# Patient Record
Sex: Female | Born: 1935
Health system: Southern US, Community
[De-identification: ages and names within clinical notes are randomized; demographics above are authoritative.]

## PROBLEM LIST (undated history)

## (undated) DIAGNOSIS — E7800 Pure hypercholesterolemia, unspecified: Secondary | ICD-10-CM

## (undated) DIAGNOSIS — E119 Type 2 diabetes mellitus without complications: Secondary | ICD-10-CM

## (undated) DIAGNOSIS — Z21 Asymptomatic human immunodeficiency virus [HIV] infection status: Secondary | ICD-10-CM

## (undated) DIAGNOSIS — E1121 Type 2 diabetes mellitus with diabetic nephropathy: Secondary | ICD-10-CM

## (undated) DIAGNOSIS — I10 Essential (primary) hypertension: Secondary | ICD-10-CM

## (undated) DIAGNOSIS — M67912 Unspecified disorder of synovium and tendon, left shoulder: Secondary | ICD-10-CM

## (undated) DIAGNOSIS — E785 Hyperlipidemia, unspecified: Secondary | ICD-10-CM

## (undated) DIAGNOSIS — I2510 Atherosclerotic heart disease of native coronary artery without angina pectoris: Secondary | ICD-10-CM

## (undated) DIAGNOSIS — M1990 Unspecified osteoarthritis, unspecified site: Secondary | ICD-10-CM

## (undated) DIAGNOSIS — I4891 Unspecified atrial fibrillation: Secondary | ICD-10-CM

## (undated) DIAGNOSIS — M67911 Unspecified disorder of synovium and tendon, right shoulder: Secondary | ICD-10-CM

## (undated) DIAGNOSIS — E559 Vitamin D deficiency, unspecified: Secondary | ICD-10-CM

## (undated) DIAGNOSIS — E876 Hypokalemia: Secondary | ICD-10-CM

## (undated) DIAGNOSIS — B20 Human immunodeficiency virus [HIV] disease: Secondary | ICD-10-CM

## (undated) DIAGNOSIS — I509 Heart failure, unspecified: Secondary | ICD-10-CM

## (undated) HISTORY — PX: OTHER SURGICAL HISTORY: SHX169

## (undated) HISTORY — DX: Hyperlipidemia, unspecified: E78.5

## (undated) HISTORY — DX: Type 2 diabetes mellitus with diabetic nephropathy: E11.21

## (undated) HISTORY — DX: Unspecified atrial fibrillation: I48.91

## (undated) HISTORY — DX: Atherosclerotic heart disease of native coronary artery without angina pectoris: I25.10

## (undated) HISTORY — DX: Human immunodeficiency virus (HIV) disease: B20

## (undated) HISTORY — DX: Hypercalcemia: E83.52

## (undated) HISTORY — DX: Essential (primary) hypertension: I10

## (undated) HISTORY — PX: APPENDECTOMY: SHX54

## (undated) HISTORY — DX: Hypokalemia: E87.6

## (undated) HISTORY — DX: Unspecified disorder of synovium and tendon, left shoulder: M67.912

## (undated) HISTORY — DX: Heart failure, unspecified: I50.9

## (undated) HISTORY — DX: Unspecified osteoarthritis, unspecified site: M19.90

## (undated) HISTORY — DX: Asymptomatic human immunodeficiency virus (hiv) infection status: Z21

## (undated) HISTORY — DX: Unspecified disorder of synovium and tendon, right shoulder: M67.911

## (undated) HISTORY — PX: ANKLE SURGERY: SHX546

## (undated) HISTORY — DX: Vitamin D deficiency, unspecified: E55.9

## (undated) HISTORY — PX: KIDNEY STONE SURGERY: SHX686

## (undated) HISTORY — PX: ABDOMINAL HYSTERECTOMY: SHX81

---

## 2014-09-16 ENCOUNTER — Emergency Department (HOSPITAL_BASED_OUTPATIENT_CLINIC_OR_DEPARTMENT_OTHER)
Admission: EM | Admit: 2014-09-16 | Discharge: 2014-09-16 | Disposition: A | Discharge status: Home / Self Care | Payer: Medicare Other | Attending: Emergency Medicine | Admitting: Emergency Medicine

## 2014-09-16 ENCOUNTER — Encounter (HOSPITAL_BASED_OUTPATIENT_CLINIC_OR_DEPARTMENT_OTHER): Payer: Self-pay

## 2014-09-16 DIAGNOSIS — Y9389 Activity, other specified: Secondary | ICD-10-CM | POA: Diagnosis not present

## 2014-09-16 DIAGNOSIS — I4891 Unspecified atrial fibrillation: Secondary | ICD-10-CM | POA: Diagnosis not present

## 2014-09-16 DIAGNOSIS — Z79899 Other long term (current) drug therapy: Secondary | ICD-10-CM | POA: Insufficient documentation

## 2014-09-16 DIAGNOSIS — T161XXA Foreign body in right ear, initial encounter: Secondary | ICD-10-CM | POA: Diagnosis not present

## 2014-09-16 DIAGNOSIS — Y9289 Other specified places as the place of occurrence of the external cause: Secondary | ICD-10-CM | POA: Insufficient documentation

## 2014-09-16 DIAGNOSIS — E780 Pure hypercholesterolemia: Secondary | ICD-10-CM | POA: Diagnosis not present

## 2014-09-16 DIAGNOSIS — X58XXXA Exposure to other specified factors, initial encounter: Secondary | ICD-10-CM | POA: Insufficient documentation

## 2014-09-16 DIAGNOSIS — E119 Type 2 diabetes mellitus without complications: Secondary | ICD-10-CM | POA: Diagnosis not present

## 2014-09-16 DIAGNOSIS — Y998 Other external cause status: Secondary | ICD-10-CM | POA: Diagnosis not present

## 2014-09-16 HISTORY — DX: Type 2 diabetes mellitus without complications: E11.9

## 2014-09-16 HISTORY — DX: Unspecified atrial fibrillation: I48.91

## 2014-09-16 HISTORY — DX: Pure hypercholesterolemia, unspecified: E78.00

## 2014-09-16 NOTE — ED Provider Notes (Signed)
CSN: [id number]     Arrival date & time 09/16/14  1133 History   First MD Initiated Contact with Patient 09/16/14 1200     Chief Complaint  Patient presents with  . Foreign Body in [place]     (Consider location/radiation/quality/duration/timing/severity/associated sxs/prior Treatment) Patient is a 81 y.o. female presenting with foreign body in ear. The history is provided by the patient.  Foreign Body in Ear Pertinent negatives include no chest pain, no abdominal pain, no headaches and no shortness of breath.   patient wears hearing aids. When removing the one from her right ear to do some cleaning the ear piece stayed in her ear. A small white mushroom shape silicone cup. This occurred approximately 2 hours ago. Causing some mild discomfort. No bleeding.  Past Medical History  Diagnosis Date  . A-fib   . High cholesterol   . Diabetes mellitus without complication    No past surgical history on file. No family history on file. History  Substance Use Topics  . Smoking status: Never Smoker   . Smokeless tobacco: Not on file  . Alcohol Use: No   OB History    No data available     Review of Systems  Constitutional: Negative for fever.  HENT: Positive for ear pain. Negative for congestion.   Eyes: Negative for redness.  Respiratory: Negative for shortness of breath.   Cardiovascular: Negative for chest pain.  Gastrointestinal: Negative for abdominal pain.  Musculoskeletal: Negative for neck pain.  Skin: Negative for rash.  Neurological: Negative for headaches.  Hematological: Does not bruise/bleed easily.  Psychiatric/Behavioral: Negative for confusion.      Allergies  Morphine and related  Home Medications   Prior to Admission medications   Medication Sig Start Date End Date Taking? Authorizing Provider  Diltiazem HCl Coated Beads (DILTIAZEM HCL CD PO) Take by mouth.   Yes Historical Provider, MD  METFORMIN HCL PO Take by mouth.   Yes Historical Provider, MD   Pravastatin Sodium (PRAVACHOL PO) Take by mouth.   Yes Historical Provider, MD  Warfarin Sodium (COUMADIN PO) Take by mouth.   Yes Historical Provider, MD   BP 150/80 mmHg  Pulse 80  Temp(Src) 98.2 F (36.8 C) (Oral)  Resp 20  Ht 5\' 3"  (1.6 m)  Wt 170 lb (77.111 kg)  BMI 30.12 kg/m2  SpO2 96% Physical Exam  Constitutional: She is oriented to person, place, and time. She appears well-developed and well-nourished. No distress.  HENT:  Head: Normocephalic and atraumatic.  Left Ear: External ear normal.  Right TM mild with hearing aid ear piecelodged in the ear canal.  Eyes: Conjunctivae and EOM are normal. Pupils are equal, round, and reactive to light.  Neck: Normal range of motion.  Cardiovascular: Normal rate, regular rhythm and normal heart sounds.   No murmur heard. Pulmonary/Chest: Effort normal and breath sounds normal. No respiratory distress.  Abdominal: Soft. There is no tenderness.  Musculoskeletal: Normal range of motion.  Neurological: She is alert and oriented to person, place, and time. No cranial nerve deficit. She exhibits normal muscle tone. Coordination normal.  By history has a hearing deficit.  Skin: Skin is warm.  Nursing note and vitals reviewed.   ED Course  Procedures (including critical care time) Labs Review Labs Reviewed - No data to display  Imaging Review No results found.   EKG Interpretation None      MDM   Final diagnoses:  Foreign body in ear, right, initial encounter   Foreign body  removed from right ear canal. This basically the ear piece that goes to her hearing aid. Patient tolerated procedure well.   Removed with alligator forceps. Some irritation to the ear canal no perforation to the eardrum no bleeding following removal of the foreign body.  Fredia Sorrow, MD 09/16/14 1309

## 2014-09-16 NOTE — Discharge Instructions (Signed)
Foreign body removed. Return for any new or worse symptoms. Experience a little bit of irritation to the ear canal on the right side. After removing the foreign body no evidence of any bleeding.

## 2014-09-16 NOTE — ED Notes (Signed)
Pt reports small end of hearing aid is lodged in right ear x 1 hour-pt A/O-NAD

## 2017-12-16 DIAGNOSIS — Z961 Presence of intraocular lens: Secondary | ICD-10-CM | POA: Diagnosis not present

## 2017-12-16 DIAGNOSIS — H02105 Unspecified ectropion of left lower eyelid: Secondary | ICD-10-CM | POA: Diagnosis not present

## 2017-12-16 DIAGNOSIS — H02831 Dermatochalasis of right upper eyelid: Secondary | ICD-10-CM | POA: Diagnosis not present

## 2017-12-16 DIAGNOSIS — H02102 Unspecified ectropion of right lower eyelid: Secondary | ICD-10-CM | POA: Diagnosis not present

## 2017-12-18 ENCOUNTER — Telehealth: Payer: Self-pay

## 2017-12-18 NOTE — Telephone Encounter (Signed)
SENT REFERRAL TO SCHEDULING AND FILED NOTES 

## 2018-01-23 ENCOUNTER — Encounter: Payer: Self-pay | Admitting: Cardiology

## 2018-01-23 ENCOUNTER — Ambulatory Visit: Payer: PPO | Admitting: Cardiology

## 2018-01-23 VITALS — BP 126/70 | HR 82 | Ht 64.0 in | Wt 173.8 lb

## 2018-01-23 DIAGNOSIS — I2729 Other secondary pulmonary hypertension: Secondary | ICD-10-CM | POA: Diagnosis not present

## 2018-01-23 DIAGNOSIS — I4821 Permanent atrial fibrillation: Secondary | ICD-10-CM | POA: Diagnosis not present

## 2018-01-23 DIAGNOSIS — E119 Type 2 diabetes mellitus without complications: Secondary | ICD-10-CM

## 2018-01-23 DIAGNOSIS — I10 Essential (primary) hypertension: Secondary | ICD-10-CM

## 2018-01-23 DIAGNOSIS — I5022 Chronic systolic (congestive) heart failure: Secondary | ICD-10-CM | POA: Diagnosis not present

## 2018-01-23 DIAGNOSIS — I2510 Atherosclerotic heart disease of native coronary artery without angina pectoris: Secondary | ICD-10-CM | POA: Diagnosis not present

## 2018-01-23 NOTE — Patient Instructions (Signed)
Medication Instructions:  The current medical regimen is effective;  continue present plan and medications.  If you need a refill on your cardiac medications before your next appointment, please call your pharmacy.   Follow-Up: At [facility], you and your health needs are our priority.  As part of our continuing mission to provide you with exceptional heart care, we have created designated Provider Care Teams.  These Care Teams include your primary Cardiologist (physician) and Advanced Practice Providers (APPs -  Physician Assistants and Nurse Practitioners) who all work together to provide you with the care you need, when you need it. You will need a follow up appointment in 6 months with Truitt Merle, NP and 12 months with Dr Marlou Porch.  Please call our office 2 months in advance to schedule this appointment.  You may see Dr Marlou Porch or one of the following Advanced Practice Providers on your designated Care Team:   Truitt Merle, NP Cecilie Kicks, NP . Kathyrn Drown, NP  Thank you for choosing [facility]!!

## 2018-01-23 NOTE — Progress Notes (Signed)
Cardiology Office Note:    Date:  01/23/2018   ID:  Erin Steele, DOB May 02, 1927, MRN [id number]  PCP:  Erin Lukes, MD  Cardiologist:  No primary care provider on file.  Electrophysiologist:  None   Referring MD: No ref. provider found     History of Present Illness:    Erin Steele is a 84 y.o. female here for new patient visit to establish care with diabetes, coronary artery disease, hypertension, chronic atrial fibrillation listed on her past medical history, moderate mitral regurgitation, nonocclusive CAD, obstructive sleep apnea..  Currently residing at facility.  In review of medical records from [facility] on 11/02/2015 she had an echocardiogram done in March 2017 that showed EF of 50 to 55%, small PFO, moderate mitral regurgitation and severe tricuspid regurgitation.  She also had moderate pulmonary hypertension.  She has a prior history of carcinoid tumor of the small intestine.  Left heart catheterization performed in July 2017 showed EF of 35 to 40% at the time with mild MR and diffuse minimal plaque throughout coronary arteries and mild pulmonary hypertension.  Irregular rhythm was noted.  Chronic atrial fibrillation was noted.  She was started on low-dose Entresto and one point.  Originally on warfarin, now Eliquis.  Diltiazem.  She did not tolerate the Entresto and was not willing to retry.  She was also switched from Cardizem to metoprolol and continued with losartan.  Continues with Lasix pravastatin and looks like she has been switched to Eliquis 5 mg twice a day.  EKG reviewed from July 11, 2017 shows atrial fibrillation nonspecific ST-T wave changes heart rate 88 bpm personally reviewed and interpreted.   She has hypothyroidism which back in July her TSH was elevated at 10.2.  She also has hypokalemia back in July potassium 3.4 on 10 mEq once a day.  Lasix use.  No muscle cramping.  Diabetes hemoglobin A1c 6, excellent.  She is a  non-smoker.  Hemoglobin 13.5 creatinine 0.9, HDL 70, LDL 35, triglycerides 76 referred to [place] from doctors making house calls.  Daughter died past year. Moved here. [place].   Overall she is homesick, was in her house for 70 years.  Her husband lives with her here, memory issues.  [facility].  She denies any recent falls, bleeding, orthopnea, PND.  She has to take her time.  Does get some shortness of breath at times.  Mild.  Her daughter here today is with her.  Past Medical History:  Diagnosis Date  . Atrial fibrillation ([place])   . Bilateral rotator cuff dysfunction   . CAD (coronary artery disease)   . CHF (congestive heart failure) ([place])   . Diabetes mellitus with diabetic nephropathy ([place])   . Hyperlipemia   . Hypertension   . Hypokalemia   . Osteoarthritis   . Vitamin D deficiency     Past Surgical History:  Procedure Laterality Date  . ABDOMINAL HYSTERECTOMY    . ANKLE SURGERY     and arms  . APPENDECTOMY    . intestinal surgery      for cancer    Current Medications: Current Meds  Medication Sig  . acetaminophen (TYLENOL) 500 MG tablet Take 500 mg by mouth 3 (three) times daily as needed for mild pain, moderate pain, fever or headache.  Marland Kitchen apixaban (ELIQUIS) 5 MG TABS tablet Take 5 mg by mouth 2 (two) times daily.  Marland Kitchen BIOFREEZE 4 % GEL Apply topically as needed (shoulders).  Marland Kitchen  Cholecalciferol 3000 units TABS Take by mouth 2 (two) times daily.  . Cinnamon 500 MG capsule Take by mouth 2 (two) times daily.  . diphenoxylate-atropine (LOMOTIL) 2.5-0.025 MG tablet Take by mouth. 2 tablets with first diarrhea stool, then 1 tablet after each subsequent diarrhea episode no more than 8 per day.  . furosemide (LASIX) 20 MG tablet Take 20 mg by mouth daily.  Marland Kitchen levothyroxine (SYNTHROID, LEVOTHROID) 25 MCG tablet Take 25 mcg by mouth daily before breakfast.  . LOSARTAN POTASSIUM PO Take 25 mg by mouth daily.  . metFORMIN (GLUCOPHAGE)  500 MG tablet Take 500 mg by mouth daily.  . metoprolol succinate (TOPROL-XL) 25 MG 24 hr tablet Take 25 mg by mouth daily.  Marland Kitchen POTASSIUM CHLORIDE PO Take 20 mEq by mouth daily.  . pravastatin (PRAVACHOL) 40 MG tablet Take 20 mg by mouth daily.     Allergies:   Morphine and related   Social History   Socioeconomic History  . Marital status: Married    Spouse name: Not on file  . Number of children: Not on file  . Years of education: Not on file  . Highest education level: Not on file  Occupational History  . Occupation: retired  Scientific laboratory technician  . Financial resource strain: Not on file  . Food insecurity:    Worry: Not on file    Inability: Not on file  . Transportation needs:    Medical: Not on file    Non-medical: Not on file  Tobacco Use  . Smoking status: Never Smoker  . Smokeless tobacco: Never Used  Substance and Sexual Activity  . Alcohol use: Yes    Comment: occasionally  . Drug use: Not on file  . Sexual activity: Not on file  Lifestyle  . Physical activity:    Days per week: Not on file    Minutes per session: Not on file  . Stress: Not on file  Relationships  . Social connections:    Talks on phone: Not on file    Gets together: Not on file    Attends religious service: Not on file    Active member of club or organization: Not on file    Attends meetings of clubs or organizations: Not on file    Relationship status: Not on file  Other Topics Concern  . Not on file  Social History Narrative  . Not on file     Family History: The patient's   Her mother father and sister all had coronary artery disease.  ROS:   Please see the history of present illness.     All other systems reviewed and are negative.  EKGs/Labs/Other Studies Reviewed:    The following studies were reviewed today: Extensive data review, records, cardiac catheterization, echocardiogram, lab work  EKG:  EKG is  ordered today.  The ekg ordered today demonstrates atrial fibrillation  heart rate 82 with nonspecific ST-T wave changes personally reviewed and interpreted no significant change from prior.  Recent Labs: No results found for requested labs within last 8760 hours.  Recent Lipid Panel No results found for: CHOL, TRIG, HDL, CHOLHDL, VLDL, LDLCALC, LDLDIRECT  Physical Exam:    VS:  BP 126/70   Pulse 82   Ht 5\' 4"  (1.626 m)   Wt 173 lb 12.8 oz (78.8 kg)   BMI 29.83 kg/m     Wt Readings from Last 3 Encounters:  01/23/18 173 lb 12.8 oz (78.8 kg)     GEN: Elderly well nourished,  well developed in no acute distress HEENT: Normal, redness lower eyelids, watery NECK: No JVD; No carotid bruits LYMPHATICS: No lymphadenopathy CARDIAC: Irregularly irregular, normal rate, soft systolic murmur, rubs, gallops RESPIRATORY:  Clear to auscultation without rales, wheezing or rhonchi  ABDOMEN: Soft, non-tender, non-distended MUSCULOSKELETAL:  No edema; No deformity  SKIN: Warm and dry NEUROLOGIC:  Alert and oriented x 3 PSYCHIATRIC:  Normal affect   ASSESSMENT:    1. Essential hypertension   2. Coronary artery disease involving native coronary artery of native heart without angina pectoris   3. Permanent atrial fibrillation   4. Chronic systolic heart failure ([place])   5. Diabetes mellitus with coincident hypertension ([place])   6. Other secondary pulmonary hypertension (HCC)    PLAN:    In order of problems listed above:  Coronary artery disease nonobstructive -Continue with secondary prevention efforts, medications reviewed as above.  Permanent atrial fibrillation - Eliquis.  No prior stroke.  Chads vas score at least 2 (age greater than 44, female, hypertension, CHF, CAD) -Hemoglobin excellent, creatinine excellent, see above.  Continue to monitor this every 6 months.  Prior chronic systolic heart failure -EF during heart catheterization in 2017 was 35 to 40%, most currently in 2017 showed 50 to 55%.  Continue with beta-blocker, angiotensin receptor blocker  and Lasix.  She did not tolerate Entresto.  I think cost was an issue.  Currently euvolemic.  Systolic dysfunction was out of proportion to coronary artery disease.  Possibly related to her atrial fibrillation.  Chronic anticoagulation -Eliquis.  Moderate mitral regurgitation, severe tricuspid regurgitation -Seen on a prior echocardiogram.  Currently stable.  Continue to manage clinically.  Secondary pulmonary hypertension - Continue to manage left heart disease.  Diabetes with hypertension -Currently excellent control.  Medications reviewed.  Medication Adjustments/Labs and Tests Ordered: Current medicines are reviewed at length with the patient today.  Concerns regarding medicines are outlined above.  Orders Placed This Encounter  Procedures  . EKG 12-Lead   No orders of the defined types were placed in this encounter.   Patient Instructions  Medication Instructions:  The current medical regimen is effective;  continue present plan and medications.  If you need a refill on your cardiac medications before your next appointment, please call your pharmacy.   Follow-Up: At [facility], you and your health needs are our priority.  As part of our continuing mission to provide you with exceptional heart care, we have created designated Provider Care Teams.  These Care Teams include your primary Cardiologist (physician) and Advanced Practice Providers (APPs -  Physician Assistants and Nurse Practitioners) who all work together to provide you with the care you need, when you need it. You will need a follow up appointment in 6 months with Truitt Merle, NP and 12 months with Dr Marlou Porch.  Please call our office 2 months in advance to schedule this appointment.  You may see Dr Marlou Porch or one of the following Advanced Practice Providers on your designated Care Team:   Truitt Merle, NP Cecilie Kicks, NP . Kathyrn Drown, NP  Thank you for choosing [facility]!!          Signed, Candee Furbish, MD  01/23/2018 9:50 AM    [place]

## 2018-01-27 ENCOUNTER — Encounter (HOSPITAL_BASED_OUTPATIENT_CLINIC_OR_DEPARTMENT_OTHER): Payer: Self-pay

## 2018-02-17 DIAGNOSIS — H02132 Senile ectropion of right lower eyelid: Secondary | ICD-10-CM | POA: Diagnosis not present

## 2018-02-17 DIAGNOSIS — H02115 Cicatricial ectropion of left lower eyelid: Secondary | ICD-10-CM | POA: Diagnosis not present

## 2018-02-17 DIAGNOSIS — H02135 Senile ectropion of left lower eyelid: Secondary | ICD-10-CM | POA: Diagnosis not present

## 2018-02-17 DIAGNOSIS — H02106 Unspecified ectropion of left eye, unspecified eyelid: Secondary | ICD-10-CM | POA: Diagnosis not present

## 2018-02-17 DIAGNOSIS — H02403 Unspecified ptosis of bilateral eyelids: Secondary | ICD-10-CM | POA: Diagnosis not present

## 2018-02-17 DIAGNOSIS — H02834 Dermatochalasis of left upper eyelid: Secondary | ICD-10-CM | POA: Diagnosis not present

## 2018-02-17 DIAGNOSIS — H02103 Unspecified ectropion of right eye, unspecified eyelid: Secondary | ICD-10-CM | POA: Diagnosis not present

## 2018-02-17 DIAGNOSIS — H02831 Dermatochalasis of right upper eyelid: Secondary | ICD-10-CM | POA: Diagnosis not present

## 2018-02-17 DIAGNOSIS — H5340 Unspecified visual field defects: Secondary | ICD-10-CM | POA: Diagnosis not present

## 2018-02-17 DIAGNOSIS — H02112 Cicatricial ectropion of right lower eyelid: Secondary | ICD-10-CM | POA: Diagnosis not present

## 2018-02-20 ENCOUNTER — Telehealth: Payer: Self-pay | Admitting: *Deleted

## 2018-02-20 DIAGNOSIS — I2510 Atherosclerotic heart disease of native coronary artery without angina pectoris: Secondary | ICD-10-CM

## 2018-02-20 NOTE — Telephone Encounter (Signed)
Of note medication list states Eliquis and warfarin. LMOM to confirm which medication pt is actually taking and confirm that she is not actively taking both - per Dr. Kingsley Plan note it appears she should be on Eliquis only. Will provide clearance once clarified above.

## 2018-02-20 NOTE — Telephone Encounter (Signed)
Pharm please address Eliquis 

## 2018-02-20 NOTE — Telephone Encounter (Signed)
   [facility] Pre-operative Risk Assessment    Request for surgical clearance:  1. What type of surgery is being performed? LATERAL TARSAL STRIP TO THE EYELIDS    2. When is this surgery scheduled? TBD   3. Are there any medications that need to be held prior to surgery and how long? ELIQUIS    4. Practice name and name of physician performing surgery? [facility]; DR. Lemmie Evens Erin Steele.   5. What is your office phone and fax number? PH# [phone]; FAX # [phone]   0. Anesthesia type (None, local, MAC, general) ? GENERAL    Erin Steele 02/20/2018, 11:48 AM  _________________________________________________________________   (provider comments below)

## 2018-02-24 NOTE — Telephone Encounter (Signed)
LMOM to follow up 

## 2018-02-25 ENCOUNTER — Other Ambulatory Visit: Payer: Self-pay

## 2018-02-25 DIAGNOSIS — I2510 Atherosclerotic heart disease of native coronary artery without angina pectoris: Secondary | ICD-10-CM

## 2018-02-25 NOTE — Telephone Encounter (Signed)
Left detailed message on daughter, Florene Glen voicemail giving her the same info I gave her mom and informed her that I would mail the lab slip. Advised to contact office with any questions or concerns.

## 2018-02-25 NOTE — Addendum Note (Signed)
Addended by: Ulice Brilliant T on: 02/25/2018 03:54 PM   Modules accepted: Orders

## 2018-02-25 NOTE — Telephone Encounter (Signed)
Spoke with patient and informed her that she must complete lab work before we can clear her for surgery. She voiced understanding. Informed patient that if she goes to the [address] location she will need an appt but the [place] office takes walk-in. Patient voiced udnerstanding.

## 2018-02-25 NOTE — Addendum Note (Signed)
Addended by: Erskine Emery on: 02/25/2018 03:38 PM   Modules accepted: Orders

## 2018-02-25 NOTE — Telephone Encounter (Signed)
Spoke with patient who reports that she is no longer taking warfarin and ONLY taking Eliquis. Have discontinued warfarin off medication list.   Patient with diagnosis of Afib on Eliquis for anticoagulation.    Procedure: lateral tarsal strip to eyelids Date of procedure: TBD  CHADS2-VASc score of  7 (CHF, HTN, AGE, DM2, stroke/tia x 2, CAD, AGE, female)  Pt has not had lab work in our system, [place] or [facility] at all. Will need to obtain BMET to ensure kidney function before providing clearance for procedure. Will route to call back pool to schedule for blood work to assess kidney function.

## 2018-02-28 ENCOUNTER — Telehealth: Payer: Self-pay

## 2018-02-28 DIAGNOSIS — I2510 Atherosclerotic heart disease of native coronary artery without angina pectoris: Secondary | ICD-10-CM | POA: Diagnosis not present

## 2018-02-28 LAB — BASIC METABOLIC PANEL
BUN/Creatinine Ratio: 17 (ref 12–28)
BUN: 17 mg/dL (ref 8–27)
CALCIUM: 10.5 mg/dL — AB (ref 8.7–10.3)
CO2: 19 mmol/L — ABNORMAL LOW (ref 20–29)
Chloride: 105 mmol/L (ref 96–106)
Creatinine, Ser: 0.99 mg/dL (ref 0.57–1.00)
GFR calc Af Amer: 59 mL/min/{1.73_m2} — ABNORMAL LOW (ref >59)
GFR calc non Af Amer: 51 mL/min/{1.73_m2} — ABNORMAL LOW (ref >59)
GLUCOSE: 98 mg/dL (ref 65–99)
POTASSIUM: 4.2 mmol/L (ref 3.5–5.2)
SODIUM: 141 mmol/L (ref 134–144)

## 2018-02-28 NOTE — Telephone Encounter (Signed)
Attempted to reach pt in regards to needing lab work. The order is in epic but there is no appointment scheduled for her to come in for the lab work.

## 2018-02-28 NOTE — Telephone Encounter (Signed)
-----   Message from Jerline Pain, MD sent at 02/28/2018  4:47 PM EST ----- Reassuring labs.  Candee Furbish, MD

## 2018-02-28 NOTE — Telephone Encounter (Signed)
LMTCB

## 2018-03-03 NOTE — Telephone Encounter (Signed)
    Chart reviewed according to preop protocol.  84 yo female with systolic HF, atrial fibrillation, pulmonary hypertension.  LHC in 2017 in [place] demonstrated minimal plaque.  EF was 35-40 on LHC but improved to norma on most recent echo.  She established with Dr. Marlou Porch in 01/2018.  She is on Eliquis for anticoagulation.   Lab Results  Component Value Date   K 4.2 02/28/2018   BUN 17 02/28/2018   CREATININE 0.99 02/28/2018    I will fwd note to CVRR to determine timing for holding anticoagulation for planned procedure.  Message left for patient to call back so that we can assess for any clinical change since her last visit (procedure to use general anesthesia). Richardson Dopp, PA-C    03/03/2018 2:10 PM

## 2018-03-03 NOTE — Telephone Encounter (Signed)
Pt with history of Afib with CHADSVASC 7. Crcl is 29ml/min. Recommend holding Eliquis for 24 hours prior to procedure.

## 2018-03-10 ENCOUNTER — Telehealth: Payer: Self-pay | Admitting: *Deleted

## 2018-03-10 NOTE — Telephone Encounter (Signed)
   [facility] Pre-operative Risk Assessment    Request for surgical clearance:  1. What type of surgery is being performed? Bilateral Upper Lid Blepharoplasty with CO2 laser,Lateral Tarsal Strip, Full thickness wedge bilaterally with laser medial spindle.  2. When is this surgery scheduled? TBD  3. What type of clearance is required (medical clearance vs. Pharmacy clearance to hold med vs. Both)? Both  4. Are there any medications that need to be held prior to surgery and how long?Eliquis prior to surgery. If able to hold,how long is pt safely able to stop this medication and when should it be resumed?   5. Practice name and name of physician performing surgery? Dr. Annell Greening at [facility]  6. What is your office phone number [phone]   7.   What is your office fax number [phone]  8.   Anesthesia type (None, local, MAC, general) ? Call [facility] center    Carollee Sires 03/10/2018, 4:49 PM  _________________________________________________________________   (provider comments below)

## 2018-03-11 NOTE — Telephone Encounter (Signed)
S/w with [facility] and verified they will be using General Anesthesia

## 2018-03-11 NOTE — Telephone Encounter (Signed)
She may proceed with eyelid surgery, low risk.  Would hold Eliquis for 2 days prior to procedure.  Candee Furbish, MD

## 2018-03-11 NOTE — Telephone Encounter (Signed)
   Primary Cardiologist: Candee Furbish, MD  Chart reviewed as part of pre-operative protocol coverage. Given advanced age and comorbidities with fairly recent OV within the last 2 months with Dr. Marlou Porch, will route to Dr. Marlou Porch for input on pre-op clearance. Dr. Marlou Porch. - Please route response to P CV DIV PREOP (the pre-op pool). Thank you.    Charlie Pitter, PA-C 03/11/2018, 4:44 PM

## 2018-03-12 ENCOUNTER — Telehealth: Payer: Self-pay

## 2018-03-12 ENCOUNTER — Encounter: Payer: Self-pay | Admitting: *Deleted

## 2018-03-12 NOTE — Telephone Encounter (Deleted)
LMTCB

## 2018-03-12 NOTE — Telephone Encounter (Signed)
2nd attempt made to call pt re: labs.Marland Kitchen LMTCB

## 2018-03-13 NOTE — Telephone Encounter (Signed)
Clearance already addressed in separate note. Viki Carrera PA-C

## 2018-03-13 NOTE — Telephone Encounter (Signed)
   Primary Cardiologist:Mark Marlou Porch, MD  Chart reviewed as part of pre-operative protocol coverage. Pre-op clearance was addressed by Dr. Marlou Porch who reviewed chart. He states, "She may proceed with eyelid surgery, low risk.  Would hold Eliquis for 2 days prior to procedure."   Will route this bundled recommendation to requesting provider via Epic fax function. Please call with questions.  Charlie Pitter, PA-C 03/13/2018, 3:08 PM

## 2018-03-17 NOTE — Telephone Encounter (Signed)
Follow up   Please call patient's daughter at the number listed.

## 2018-03-17 NOTE — Telephone Encounter (Signed)
New message   Patient's daughter is returning your call.

## 2018-03-19 ENCOUNTER — Encounter: Payer: Self-pay | Admitting: Family

## 2018-03-19 ENCOUNTER — Ambulatory Visit (HOSPITAL_BASED_OUTPATIENT_CLINIC_OR_DEPARTMENT_OTHER)
Admission: RE | Admit: 2018-03-19 | Discharge: 2018-03-19 | Disposition: A | Discharge status: Home / Self Care | Payer: PPO | Source: Ambulatory Visit | Attending: Family | Admitting: Family

## 2018-03-19 ENCOUNTER — Ambulatory Visit (INDEPENDENT_AMBULATORY_CARE_PROVIDER_SITE_OTHER): Payer: PPO | Admitting: Family

## 2018-03-19 VITALS — BP 123/61 | HR 63 | Temp 98.9°F | Resp 16 | Ht 64.0 in | Wt 167.0 lb

## 2018-03-19 DIAGNOSIS — H02105 Unspecified ectropion of left lower eyelid: Secondary | ICD-10-CM

## 2018-03-19 DIAGNOSIS — H02102 Unspecified ectropion of right lower eyelid: Secondary | ICD-10-CM

## 2018-03-19 DIAGNOSIS — R059 Cough, unspecified: Secondary | ICD-10-CM

## 2018-03-19 DIAGNOSIS — J40 Bronchitis, not specified as acute or chronic: Secondary | ICD-10-CM

## 2018-03-19 DIAGNOSIS — R05 Cough: Secondary | ICD-10-CM | POA: Insufficient documentation

## 2018-03-19 MED ORDER — AZITHROMYCIN 250 MG PO TABS: ORAL_TABLET | ORAL | 0 refills | Status: DC

## 2018-03-19 MED ORDER — BENZONATATE 100 MG PO CAPS: 100.0000 mg | ORAL_CAPSULE | Freq: Three times a day (TID) | ORAL | 0 refills | Status: DC | PRN

## 2018-03-19 NOTE — Progress Notes (Signed)
Subjective:    Patient ID: Erin Steele, female    DOB: 1927-10-09, 84 y.o.   MRN: [id number]  HPI  Patient is an 84 yr old female (new patient scheduled to formally establish with Dr. Charlett Blake on 03/25/18) who presents today with complaint of nasal congestion/cough.  She reports that her symptoms started 10 days ago. Started as a sore throat then went her chest.  Reports that she has had a coarse cough.  Reports cough is productive of green and yellow sputum.  Denies associated fever.  Reports + associated fatigue/malaise. Reports that she had a flu shot in October. Has been using coricidin/mucinex without significant improvement in her symptoms.     Review of Systems   See HPI Past Medical History:  Diagnosis Date  . A-fib ([place])   . Atrial fibrillation ([place])   . Bilateral rotator cuff dysfunction   . CAD (coronary artery disease)   . CHF (congestive heart failure) ([place])   . Diabetes mellitus with diabetic nephropathy ([place])   . Diabetes mellitus without complication ([place])   . High cholesterol   . Hyperlipemia   . Hypertension   . Hypokalemia   . Osteoarthritis   . Vitamin D deficiency      Social History   Socioeconomic History  . Marital status: Married    Spouse name: Not on file  . Number of children: Not on file  . Years of education: Not on file  . Highest education level: Not on file  Occupational History  . Occupation: retired  Scientific laboratory technician  . Financial resource strain: Not on file  . Food insecurity:    Worry: Not on file    Inability: Not on file  . Transportation needs:    Medical: Not on file    Non-medical: Not on file  Tobacco Use  . Smoking status: Never Smoker  . Smokeless tobacco: Never Used  Substance and Sexual Activity  . Alcohol use: Yes    Comment: occasionally  . Drug use: No  . Sexual activity: Not on file  Lifestyle  . Physical activity:    Days per week: Not on file    Minutes per session: Not on file  . Stress: Not on file    Relationships  . Social connections:    Talks on phone: Not on file    Gets together: Not on file    Attends religious service: Not on file    Active member of club or organization: Not on file    Attends meetings of clubs or organizations: Not on file    Relationship status: Not on file  . Intimate partner violence:    Fear of current or ex partner: Not on file    Emotionally abused: Not on file    Physically abused: Not on file    Forced sexual activity: Not on file  Other Topics Concern  . Not on file  Social History Narrative   ** Merged History Encounter **        Past Surgical History:  Procedure Laterality Date  . ABDOMINAL HYSTERECTOMY    . ANKLE SURGERY     and arms  . APPENDECTOMY    . intestinal surgery      for cancer    No family history on file.  Allergies  Allergen Reactions  . Morphine And Related     Current Outpatient Medications on File Prior to Visit  Medication Sig Dispense Refill  . acetaminophen (TYLENOL) 500 MG tablet Take  500 mg by mouth 3 (three) times daily as needed for mild pain, moderate pain, fever or headache.    Marland Kitchen apixaban (ELIQUIS) 5 MG TABS tablet Take 5 mg by mouth 2 (two) times daily.    Marland Kitchen BIOFREEZE 4 % GEL Apply topically as needed (shoulders).    . Cholecalciferol 3000 units TABS Take by mouth 2 (two) times daily.    . Cinnamon 500 MG capsule Take by mouth 2 (two) times daily.    . Diltiazem HCl Coated Beads (DILTIAZEM HCL CD PO) Take by mouth.    . diphenoxylate-atropine (LOMOTIL) 2.5-0.025 MG tablet Take by mouth. 2 tablets with first diarrhea stool, then 1 tablet after each subsequent diarrhea episode no more than 8 per day.    . furosemide (LASIX) 20 MG tablet Take 20 mg by mouth daily.    Marland Kitchen levothyroxine (SYNTHROID, LEVOTHROID) 25 MCG tablet Take 25 mcg by mouth daily before breakfast.    . LOSARTAN POTASSIUM PO Take 25 mg by mouth daily.    . metFORMIN (GLUCOPHAGE) 500 MG tablet Take 500 mg by mouth daily.    Marland Kitchen METFORMIN  HCL PO Take by mouth.    . metoprolol succinate (TOPROL-XL) 25 MG 24 hr tablet Take 25 mg by mouth daily.    Marland Kitchen POTASSIUM CHLORIDE PO Take 20 mEq by mouth daily.    . pravastatin (PRAVACHOL) 40 MG tablet Take 20 mg by mouth daily.    . Pravastatin Sodium (PRAVACHOL PO) Take by mouth.     No current facility-administered medications on file prior to visit.     BP 123/61 (BP Location: Left Arm, Patient Position: Sitting, Cuff Size: Small)   Pulse 63   Temp 98.9 F (37.2 C) (Oral)   Resp 16   Ht 5\' 4"  (1.626 m)   Wt 167 lb (75.8 kg)   SpO2 97%   BMI 28.67 kg/m       Objective:   Physical Exam  Constitutional: She is oriented to person, place, and time. She appears well-developed and well-nourished.  HENT:  Head: Normocephalic and atraumatic.  Right Ear: Tympanic membrane and ear canal normal.  Left Ear: Tympanic membrane and ear canal normal.  Mouth/Throat: No oropharyngeal exudate, posterior oropharyngeal edema or posterior oropharyngeal erythema.  Eyes:  Ectropion noted bilateral lower lids  Neck: Neck supple. No thyromegaly present.  Cardiovascular: Normal rate, regular rhythm and normal heart sounds.  No murmur heard. Pulmonary/Chest: Effort normal. No respiratory distress. She has no wheezes. She has rales in the right lower field.  Lymphadenopathy:    She has no cervical adenopathy.  Neurological: She is alert and oriented to person, place, and time.  Skin: Skin is warm and dry.  Psychiatric: She has a normal mood and affect. Her behavior is normal. Judgment and thought content normal.          Assessment & Plan:  Bronchitis- will rx with azithromycin, prn tessalon. Obtain CXR to exclude PNA. Pt is advised to call if symptoms worsen or if symptoms are not improved in 3 days. Forms filled for her senior living facility.   Ectropion- she states that she has surgery scheduled for repair as well as upper lid lift.

## 2018-03-19 NOTE — Patient Instructions (Signed)
Please complete chest x-ray on the first floor. Start azithromycin (antibiotic) and as tessalon for cough. Call if new/worsening symptoms, or if you are not improved in 3 days.  Keep your upcoming appointment with Dr. Charlett Blake.

## 2018-03-21 ENCOUNTER — Other Ambulatory Visit: Payer: Self-pay | Admitting: Family

## 2018-03-21 NOTE — Telephone Encounter (Signed)
Please contact pt and let her know that I looked over her chart.  Due to her intolerance to morphine I can't give her a codeine cough syrup.  I would recommend a short course of low dose prednisone to help with inflammation in her lungs as well as over the counter robitussin DM.  I have pended both but I don't have a pharmacy listed for her.

## 2018-03-22 MED ORDER — GUAIFENESIN-DM 100-10 MG/5ML PO SYRP: 5.0000 mL | ORAL_SOLUTION | ORAL | 0 refills | Status: DC | PRN

## 2018-03-22 MED ORDER — PREDNISONE 20 MG PO TABS: 20.0000 mg | ORAL_TABLET | Freq: Every day | ORAL | 0 refills | Status: DC

## 2018-03-22 NOTE — Telephone Encounter (Signed)
Spoke to associate at [facility] and they requested that I send rx to [facility].  I spoke to patient and she reports feeling "a little better" today but still coughing.

## 2018-03-25 ENCOUNTER — Encounter: Payer: Self-pay | Admitting: Family Medicine

## 2018-03-25 ENCOUNTER — Ambulatory Visit (INDEPENDENT_AMBULATORY_CARE_PROVIDER_SITE_OTHER): Payer: PPO | Admitting: Family Medicine

## 2018-03-25 ENCOUNTER — Ambulatory Visit: Payer: Self-pay

## 2018-03-25 DIAGNOSIS — E559 Vitamin D deficiency, unspecified: Secondary | ICD-10-CM | POA: Diagnosis not present

## 2018-03-25 DIAGNOSIS — I4821 Permanent atrial fibrillation: Secondary | ICD-10-CM | POA: Insufficient documentation

## 2018-03-25 DIAGNOSIS — E876 Hypokalemia: Secondary | ICD-10-CM

## 2018-03-25 DIAGNOSIS — C189 Malignant neoplasm of colon, unspecified: Secondary | ICD-10-CM

## 2018-03-25 DIAGNOSIS — M67912 Unspecified disorder of synovium and tendon, left shoulder: Secondary | ICD-10-CM

## 2018-03-25 DIAGNOSIS — I4891 Unspecified atrial fibrillation: Secondary | ICD-10-CM

## 2018-03-25 DIAGNOSIS — I2510 Atherosclerotic heart disease of native coronary artery without angina pectoris: Secondary | ICD-10-CM | POA: Diagnosis not present

## 2018-03-25 DIAGNOSIS — E1121 Type 2 diabetes mellitus with diabetic nephropathy: Secondary | ICD-10-CM

## 2018-03-25 DIAGNOSIS — E119 Type 2 diabetes mellitus without complications: Secondary | ICD-10-CM

## 2018-03-25 DIAGNOSIS — M67911 Unspecified disorder of synovium and tendon, right shoulder: Secondary | ICD-10-CM | POA: Diagnosis not present

## 2018-03-25 DIAGNOSIS — I10 Essential (primary) hypertension: Secondary | ICD-10-CM | POA: Insufficient documentation

## 2018-03-25 DIAGNOSIS — R739 Hyperglycemia, unspecified: Secondary | ICD-10-CM | POA: Insufficient documentation

## 2018-03-25 DIAGNOSIS — M1990 Unspecified osteoarthritis, unspecified site: Secondary | ICD-10-CM | POA: Diagnosis not present

## 2018-03-25 DIAGNOSIS — E7800 Pure hypercholesterolemia, unspecified: Secondary | ICD-10-CM

## 2018-03-25 DIAGNOSIS — J40 Bronchitis, not specified as acute or chronic: Secondary | ICD-10-CM | POA: Insufficient documentation

## 2018-03-25 HISTORY — DX: Hypercalcemia: E83.52

## 2018-03-25 MED ORDER — CEFDINIR 300 MG PO CAPS: 300.0000 mg | ORAL_CAPSULE | Freq: Two times a day (BID) | ORAL | 0 refills | Status: DC

## 2018-03-25 MED ORDER — FLUOXETINE HCL 10 MG PO TABS: 10.0000 mg | ORAL_TABLET | Freq: Every day | ORAL | 3 refills | Status: DC

## 2018-03-25 MED ORDER — METHYLPREDNISOLONE 4 MG PO TABS: ORAL_TABLET | ORAL | 0 refills | Status: DC

## 2018-03-25 MED ORDER — GUAIFENESIN-DM 100-10 MG/5ML PO SYRP: 5.0000 mL | ORAL_SOLUTION | ORAL | 0 refills | Status: DC | PRN

## 2018-03-25 NOTE — Telephone Encounter (Signed)
Message from Vernona Rieger sent at 03/25/2018 4:40 PM EST   Patty Sermons with [facility] called and said that the patient was in the office today and three medications were called into [place], [place] - [address]. Pharmacy stated to her they never received it but it was confirmed at 3:50pm today, I asked her to call the pharmacy back. She stated she would like those sent to their facility @ [phone] attention [place]. Please Advise  cefdinir (OMNICEF) 300 MG capsule methylPREDNISolone (MEDROL) 4 MG tablet FLUoxetine (PROZAC) 10 MG tablet

## 2018-03-25 NOTE — Assessment & Plan Note (Signed)
Cefdinir 300 mg twice daily, Mucinex twice daily

## 2018-03-25 NOTE — Patient Instructions (Signed)
Preventive Care 82 Years and Older, Female Preventive care refers to lifestyle choices and visits with your health care provider that can promote health and wellness. What does preventive care include?  A yearly physical exam. This is also called an annual well check.  Dental exams once or twice a year.  Routine eye exams. Ask your health care provider how often you should have your eyes checked.  Personal lifestyle choices, including: ? Daily care of your teeth and gums. ? Regular physical activity. ? Eating a healthy diet. ? Avoiding tobacco and drug use. ? Limiting alcohol use. ? Practicing safe sex. ? Taking low-dose aspirin every day. ? Taking vitamin and mineral supplements as recommended by your health care provider. What happens during an annual well check? The services and screenings done by your health care provider during your annual well check will depend on your age, overall health, lifestyle risk factors, and family history of disease. Counseling Your health care provider may ask you questions about your:  Alcohol use.  Tobacco use.  Drug use.  Emotional well-being.  Home and relationship well-being.  Sexual activity.  Eating habits.  History of falls.  Memory and ability to understand (cognition).  Work and work Statistician.  Reproductive health.  Screening You may have the following tests or measurements:  Height, weight, and BMI.  Blood pressure.  Lipid and cholesterol levels. These may be checked every 5 years, or more frequently if you are over 32 years old.  Skin check.  Lung cancer screening. You may have this screening every year starting at age 24 if you have a 30-pack-year history of smoking and currently smoke or have quit within the past 15 years.  Fecal occult blood test (FOBT) of the stool. You may have this test every year starting at age 47.  Flexible sigmoidoscopy or colonoscopy. You may have a sigmoidoscopy every 5 years or  a colonoscopy every 10 years starting at age 29.  Hepatitis C blood test.  Hepatitis B blood test.  Sexually transmitted disease (STD) testing.  Diabetes screening. This is done by checking your blood sugar (glucose) after you have not eaten for a while (fasting). You may have this done every 1-3 years.  Bone density scan. This is done to screen for osteoporosis. You may have this done starting at age 40.  Mammogram. This may be done every 1-2 years. Talk to your health care provider about how often you should have regular mammograms.  Talk with your health care provider about your test results, treatment options, and if necessary, the need for more tests. Vaccines Your health care provider may recommend certain vaccines, such as:  Influenza vaccine. This is recommended every year.  Tetanus, diphtheria, and acellular pertussis (Tdap, Td) vaccine. You may need a Td booster every 10 years.  Varicella vaccine. You may need this if you have not been vaccinated.  Zoster vaccine. You may need this after age 53.  Measles, mumps, and rubella (MMR) vaccine. You may need at least one dose of MMR if you were born in 1957 or later. You may also need a second dose.  Pneumococcal 13-valent conjugate (PCV13) vaccine. One dose is recommended after age 15.  Pneumococcal polysaccharide (PPSV23) vaccine. One dose is recommended after age 48.  Meningococcal vaccine. You may need this if you have certain conditions.  Hepatitis A vaccine. You may need this if you have certain conditions or if you travel or work in places where you may be exposed to hepatitis  A.  Hepatitis B vaccine. You may need this if you have certain conditions or if you travel or work in places where you may be exposed to hepatitis B.  Haemophilus influenzae type b (Hib) vaccine. You may need this if you have certain conditions.  Talk to your health care provider about which screenings and vaccines you need and how often you  need them. This information is not intended to replace advice given to you by your health care provider. Make sure you discuss any questions you have with your health care provider. Document Released: 04/22/2015 Document Revised: 12/14/2015 Document Reviewed: 01/25/2015 Elsevier Interactive Patient Education  [employer].

## 2018-03-25 NOTE — Assessment & Plan Note (Signed)
2005 diagnosis. S/p partial colectomy part of small and large intestine

## 2018-03-25 NOTE — Assessment & Plan Note (Signed)
Supplement and monitor. Request records from previous PMD

## 2018-03-25 NOTE — Assessment & Plan Note (Signed)
Encouraged heart healthy diet, increase exercise, avoid trans fats, consider a krill oil cap daily 

## 2018-03-25 NOTE — Assessment & Plan Note (Signed)
Tolerating Eliquis and rate controlled 

## 2018-03-25 NOTE — Assessment & Plan Note (Signed)
Check cmp and vitamin d and PTH

## 2018-03-25 NOTE — Assessment & Plan Note (Signed)
hgba1c acceptable, minimize simple carbs. Increase exercise as tolerated. Continue current meds 

## 2018-03-26 LAB — COMPREHENSIVE METABOLIC PANEL
ALT: 20 U/L (ref 0–35)
AST: 19 U/L (ref 0–37)
Albumin: 4.1 g/dL (ref 3.5–5.2)
Alkaline Phosphatase: 71 U/L (ref 39–117)
BUN: 18 mg/dL (ref 6–23)
CHLORIDE: 114 meq/L — AB (ref 96–112)
CO2: 21 mEq/L (ref 19–32)
CREATININE: 1.03 mg/dL (ref 0.40–1.20)
Calcium: 10.2 mg/dL (ref 8.4–10.5)
GFR: 53.69 mL/min — ABNORMAL LOW (ref >60.00)
Glucose, Bld: 110 mg/dL — ABNORMAL HIGH (ref 70–99)
Potassium: 4.5 mEq/L (ref 3.5–5.1)
Sodium: 145 mEq/L (ref 135–145)
Total Bilirubin: 0.3 mg/dL (ref 0.2–1.2)
Total Protein: 6.8 g/dL (ref 6.0–8.3)

## 2018-03-26 LAB — LIPID PANEL
CHOL/HDL RATIO: 3
Cholesterol: 81 mg/dL (ref 0–200)
HDL: 32 mg/dL — ABNORMAL LOW (ref >39.00)
LDL Cholesterol: 13 mg/dL (ref 0–99)
NonHDL: 49.43
Triglycerides: 181 mg/dL — ABNORMAL HIGH (ref 0.0–149.0)
VLDL: 36.2 mg/dL (ref 0.0–40.0)

## 2018-03-26 LAB — PTH, INTACT AND CALCIUM
Calcium: 10 mg/dL (ref 8.6–10.4)
PTH: 47 pg/mL (ref 14–64)

## 2018-03-26 LAB — CBC
HCT: 37.3 % (ref 36.0–46.0)
Hemoglobin: 12.5 g/dL (ref 12.0–15.0)
MCHC: 33.4 g/dL (ref 30.0–36.0)
MCV: 94.4 fl (ref 78.0–100.0)
Platelets: 322 10*3/uL (ref 150.0–400.0)
RBC: 3.95 Mil/uL (ref 3.87–5.11)
RDW: 14 % (ref 11.5–15.5)
WBC: 10.1 10*3/uL (ref 4.0–10.5)

## 2018-03-26 LAB — HEMOGLOBIN A1C: Hgb A1c MFr Bld: 6.3 % (ref 4.6–6.5)

## 2018-03-26 LAB — VITAMIN D 25 HYDROXY (VIT D DEFICIENCY, FRACTURES): VITD: 36.56 ng/mL (ref 30.00–100.00)

## 2018-03-26 LAB — TSH: TSH: 2.71 u[IU]/mL (ref 0.35–4.50)

## 2018-03-27 MED ORDER — FLUOXETINE HCL 10 MG PO TABS: 10.0000 mg | ORAL_TABLET | Freq: Every day | ORAL | 3 refills | Status: DC

## 2018-03-27 MED ORDER — METHYLPREDNISOLONE 4 MG PO TABS: ORAL_TABLET | ORAL | 0 refills | Status: DC

## 2018-03-27 MED ORDER — CEFDINIR 300 MG PO CAPS: 300.0000 mg | ORAL_CAPSULE | Freq: Two times a day (BID) | ORAL | 0 refills | Status: AC

## 2018-03-27 NOTE — Addendum Note (Signed)
Addended by: Magdalene Molly A on: 03/27/2018 09:49 AM   Modules accepted: Orders

## 2018-03-27 NOTE — Progress Notes (Signed)
Subjective:    Patient ID: Erin Steele, female    DOB: 1927/06/10, 84 y.o.   MRN: [id number]  No chief complaint on file.   HPI Patient is in today for new patient appointment accompanied by her daughter she is a patient with [place].  She is living at [place] so she can be near family and has settled in well.  She is not feeling well today.  She acknowledges sinus pressure and head congestion worse on the right side.  She has chills and malaise she has a cough and congestion.  She also notes several loose stool daily.  She has a past medical history significant for diabetes, hypertension hyperlipidemia and more.  She is scheduled to have surgery at [facility] in their [facility] on January 31 of 2020.  Notes her blood sugars have been running between 85 and 120 recently.  Denies polyuria or polydipsia. Denies CP/palp/SOB/HA/fevers/GI or GU c/o. Taking meds as prescribed  Past Medical History:  Diagnosis Date  . A-fib ([place])   . Atrial fibrillation ([place])   . Bilateral rotator cuff dysfunction   . CAD (coronary artery disease)   . CHF (congestive heart failure) ([place])   . Diabetes mellitus with diabetic nephropathy ([place])   . Diabetes mellitus without complication ([place])   . High cholesterol   . HIV infection ([place])   . Hypercalcemia 03/25/2018  . Hyperlipemia   . Hypertension   . Hypokalemia   . Osteoarthritis   . Vitamin D deficiency     Past Surgical History:  Procedure Laterality Date  . ABDOMINAL HYSTERECTOMY    . ANKLE SURGERY     and arms  . APPENDECTOMY    . chicken pox    . intestinal surgery      for cancer  . KIDNEY STONE SURGERY    . measles      Family History  Problem Relation Age of Onset  . COPD Mother        emphysema, nonsmoker  . Heart disease Sister   . Cancer Sister        lung CA in smoker  . Heart disease Daughter   . Heart disease Maternal Grandmother   . Heart disease Maternal Grandfather   . Heart disease Daughter     Social History    Socioeconomic History  . Marital status: Married    Spouse name: Not on file  . Number of children: Not on file  . Years of education: Not on file  . Highest education level: Not on file  Occupational History  . Occupation: retired  Scientific laboratory technician  . Financial resource strain: Not on file  . Food insecurity:    Worry: Not on file    Inability: Not on file  . Transportation needs:    Medical: Not on file    Non-medical: Not on file  Tobacco Use  . Smoking status: Never Smoker  . Smokeless tobacco: Never Used  Substance and Sexual Activity  . Alcohol use: Yes    Comment: occasionally, mexican milkshake at Christmas  . Drug use: No  . Sexual activity: Not on file  Lifestyle  . Physical activity:    Days per week: Not on file    Minutes per session: Not on file  . Stress: Not on file  Relationships  . Social connections:    Talks on phone: Not on file    Gets together: Not on file    Attends religious service: Not on file  Active member of club or organization: Not on file    Attends meetings of clubs or organizations: Not on file    Relationship status: Not on file  . Intimate partner violence:    Fear of current or ex partner: Not on file    Emotionally abused: Not on file    Physically abused: Not on file    Forced sexual activity: Not on file  Other Topics Concern  . Not on file  Social History Narrative       was a homemaker with many side jobs   Raised 4 children has relocated here from [facility] to be near family   Lives a [place] with husband   No dietary restrictions    Outpatient Medications Prior to Visit  Medication Sig Dispense Refill  . acetaminophen (TYLENOL) 500 MG tablet Take 500 mg by mouth 3 (three) times daily as needed for mild pain, moderate pain, fever or headache.    Marland Kitchen apixaban (ELIQUIS) 5 MG TABS tablet Take 5 mg by mouth 2 (two) times daily.    Marland Kitchen BIOFREEZE 4 % GEL Apply topically as needed (shoulders).    . Cholecalciferol 3000 units  TABS Take by mouth 2 (two) times daily.    . Cinnamon 500 MG capsule Take by mouth 2 (two) times daily.    . diphenoxylate-atropine (LOMOTIL) 2.5-0.025 MG tablet Take by mouth. 2 tablets with first diarrhea stool, then 1 tablet after each subsequent diarrhea episode no more than 8 per day.    . furosemide (LASIX) 20 MG tablet Take 20 mg by mouth daily.    Marland Kitchen levothyroxine (SYNTHROID, LEVOTHROID) 25 MCG tablet Take 25 mcg by mouth daily before breakfast.    . LOSARTAN POTASSIUM PO Take 25 mg by mouth daily.    . metFORMIN (GLUCOPHAGE) 500 MG tablet Take 500 mg by mouth daily.    . metoprolol succinate (TOPROL-XL) 25 MG 24 hr tablet Take 25 mg by mouth daily.    Marland Kitchen POTASSIUM CHLORIDE PO Take 20 mEq by mouth daily.    . pravastatin (PRAVACHOL) 40 MG tablet Take 20 mg by mouth daily.    Marland Kitchen azithromycin (ZITHROMAX) 250 MG tablet 2 tabs by mouth today, then one tablet by mouth daily for 4 more days. 6 tablet 0  . benzonatate (TESSALON) 100 MG capsule Take 1 capsule (100 mg total) by mouth 3 (three) times daily as needed for cough. 20 capsule 0  . Diltiazem HCl Coated Beads (DILTIAZEM HCL CD PO) Take by mouth.    Marland Kitchen guaiFENesin-dextromethorphan (ROBITUSSIN DM) 100-10 MG/5ML syrup Take 5 mLs by mouth every 4 (four) hours as needed for cough. 118 mL 0  . METFORMIN HCL PO Take by mouth.    . Pravastatin Sodium (PRAVACHOL PO) Take by mouth.    . predniSONE (DELTASONE) 20 MG tablet Take 1 tablet (20 mg total) by mouth daily with breakfast for 5 days. 5 tablet 0   No facility-administered medications prior to visit.     Allergies  Allergen Reactions  . Morphine And Related     Review of Systems  Constitutional: Positive for chills and malaise/fatigue. Negative for fever.  HENT: Positive for congestion.   Eyes: Negative for blurred vision.  Respiratory: Negative for shortness of breath.   Cardiovascular: Negative for chest pain, palpitations and leg swelling.  Gastrointestinal: Positive for diarrhea.  Negative for abdominal pain, blood in stool and nausea.  Genitourinary: Negative for dysuria and frequency.  Musculoskeletal: Negative for falls.  Skin: Negative  for rash.  Neurological: Positive for headaches. Negative for dizziness and loss of consciousness.  Endo/Heme/Allergies: Negative for environmental allergies.  Psychiatric/Behavioral: Negative for depression. The patient is not nervous/anxious.        Objective:    Physical Exam Constitutional:      General: She is not in acute distress.    Appearance: She is well-developed.  HENT:     Head: Normocephalic and atraumatic.     Ears:     Comments: TMs erythematous, nasal mucosa boggy and erytheamtous Eyes:     Conjunctiva/sclera: Conjunctivae normal.  Neck:     Musculoskeletal: Neck supple.     Thyroid: No thyromegaly.  Cardiovascular:     Rate and Rhythm: Normal rate. Rhythm irregular.     Heart sounds: Normal heart sounds. No murmur.  Pulmonary:     Effort: Pulmonary effort is normal. No respiratory distress.     Breath sounds: Normal breath sounds.  Abdominal:     General: Bowel sounds are normal. There is no distension.     Palpations: Abdomen is soft. There is no mass.     Tenderness: There is no abdominal tenderness.  Lymphadenopathy:     Cervical: No cervical adenopathy.  Skin:    General: Skin is warm and dry.  Neurological:     Mental Status: She is alert and oriented to person, place, and time.  Psychiatric:        Behavior: Behavior normal.     BP 110/78 (BP Location: Left Arm, Patient Position: Sitting, Cuff Size: Normal)   Pulse 84   Temp 97.6 F (36.4 C) (Oral)   Resp 18   Ht 5\' 4"  (1.626 m)   Wt 169 lb (76.7 kg)   SpO2 96%   BMI 29.01 kg/m  Wt Readings from Last 3 Encounters:  03/25/18 169 lb (76.7 kg)  03/19/18 167 lb (75.8 kg)  01/23/18 173 lb 12.8 oz (78.8 kg)     Lab Results  Component Value Date   WBC 10.1 03/25/2018   HGB 12.5 03/25/2018   HCT 37.3 03/25/2018   PLT 322.0  03/25/2018   GLUCOSE 110 (H) 03/25/2018   CHOL 81 03/25/2018   TRIG 181.0 (H) 03/25/2018   HDL 32.00 (L) 03/25/2018   LDLCALC 13 03/25/2018   ALT 20 03/25/2018   AST 19 03/25/2018   NA 145 03/25/2018   K 4.5 03/25/2018   CL 114 (H) 03/25/2018   CREATININE 1.03 03/25/2018   BUN 18 03/25/2018   CO2 21 03/25/2018   TSH 2.71 03/25/2018   HGBA1C 6.3 03/25/2018    Lab Results  Component Value Date   TSH 2.71 03/25/2018   Lab Results  Component Value Date   WBC 10.1 03/25/2018   HGB 12.5 03/25/2018   HCT 37.3 03/25/2018   MCV 94.4 03/25/2018   PLT 322.0 03/25/2018   Lab Results  Component Value Date   NA 145 03/25/2018   K 4.5 03/25/2018   CO2 21 03/25/2018   GLUCOSE 110 (H) 03/25/2018   BUN 18 03/25/2018   CREATININE 1.03 03/25/2018   BILITOT 0.3 03/25/2018   ALKPHOS 71 03/25/2018   AST 19 03/25/2018   ALT 20 03/25/2018   PROT 6.8 03/25/2018   ALBUMIN 4.1 03/25/2018   CALCIUM 10.2 03/25/2018   CALCIUM 10.0 03/25/2018   GFR 53.69 (L) 03/25/2018   Lab Results  Component Value Date   CHOL 81 03/25/2018   Lab Results  Component Value Date   HDL 32.00 (L) 03/25/2018  Lab Results  Component Value Date   LDLCALC 13 03/25/2018   Lab Results  Component Value Date   TRIG 181.0 (H) 03/25/2018   Lab Results  Component Value Date   CHOLHDL 3 03/25/2018   Lab Results  Component Value Date   HGBA1C 6.3 03/25/2018       Assessment & Plan:   Problem List Items Addressed This Visit    Osteoarthritis   A-fib ([place])    Tolerating Eliquis and rate controlled      Hypokalemia   Hypertension   Relevant Orders   CBC (Completed)   Comprehensive metabolic panel (Completed)   TSH (Completed)   High cholesterol    Encouraged heart healthy diet, increase exercise, avoid trans fats, consider a krill oil cap daily      Relevant Orders   Lipid panel (Completed)   Diabetes mellitus with diabetic nephropathy ([place])   Relevant Orders   Hemoglobin A1c (Completed)     Diabetes mellitus without complication (HCC)    GNFA2Z acceptable, minimize simple carbs. Increase exercise as tolerated. Continue current meds      Vitamin D deficiency    Supplement and monitor. Request records from previous PMD      Relevant Orders   VITAMIN D 25 Hydroxy (Vit-D Deficiency, Fractures) (Completed)   Bilateral rotator cuff dysfunction   CAD (coronary artery disease)   Hypercalcemia    Check cmp and vitamin d and PTH      Relevant Orders   PTH, Intact and Calcium (Completed)   Colon cancer ([place])    2005 diagnosis. S/p partial colectomy part of small and large intestine      Bronchitis    Cefdinir 300 mg twice daily, Mucinex twice daily         I have discontinued Erin Steele's METFORMIN HCL PO, Pravastatin Sodium (PRAVACHOL PO), Diltiazem HCl Coated Beads (DILTIAZEM HCL CD PO), azithromycin, benzonatate, and predniSONE. I am also having her maintain her guaiFENesin-dextromethorphan, POTASSIUM CHLORIDE PO, Cinnamon, Cholecalciferol, BIOFREEZE, pravastatin, metoprolol succinate, LOSARTAN POTASSIUM PO, furosemide, diphenoxylate-atropine, apixaban, acetaminophen, levothyroxine, and metFORMIN.  Meds ordered this encounter  Medications  . DISCONTD: cefdinir (OMNICEF) 300 MG capsule    Sig: Take 1 capsule (300 mg total) by mouth 2 (two) times daily for 10 days.    Dispense:  20 capsule    Refill:  0  . DISCONTD: methylPREDNISolone (MEDROL) 4 MG tablet    Sig: 5 tab po qd X 1d then 4 tab po qd X 1d then 3 tab po qd X 1d then 2 tab po qd then 1 tab po qd    Dispense:  15 tablet    Refill:  0  . DISCONTD: FLUoxetine (PROZAC) 10 MG tablet    Sig: Take 1 tablet (10 mg total) by mouth daily.    Dispense:  30 tablet    Refill:  3  . guaiFENesin-dextromethorphan (ROBITUSSIN DM) 100-10 MG/5ML syrup    Sig: Take 5 mLs by mouth every 4 (four) hours as needed for cough.    Dispense:  118 mL    Refill:  0     Penni Homans, MD

## 2018-04-28 ENCOUNTER — Telehealth: Payer: Self-pay | Admitting: *Deleted

## 2018-04-28 NOTE — Telephone Encounter (Signed)
Received Physician Orders from [facility]; forwarded to provider/SLS 01/20

## 2018-05-08 ENCOUNTER — Telehealth: Payer: Self-pay | Admitting: *Deleted

## 2018-05-08 NOTE — Telephone Encounter (Signed)
Received Physician Orders from [facility]; forwarded to provider/SLS 01/30

## 2018-05-09 DIAGNOSIS — H02115 Cicatricial ectropion of left lower eyelid: Secondary | ICD-10-CM | POA: Diagnosis not present

## 2018-05-09 DIAGNOSIS — H02131 Senile ectropion of right upper eyelid: Secondary | ICD-10-CM | POA: Diagnosis not present

## 2018-05-09 DIAGNOSIS — H02112 Cicatricial ectropion of right lower eyelid: Secondary | ICD-10-CM | POA: Diagnosis not present

## 2018-05-09 DIAGNOSIS — H02135 Senile ectropion of left lower eyelid: Secondary | ICD-10-CM | POA: Diagnosis not present

## 2018-05-09 DIAGNOSIS — H53462 Homonymous bilateral field defects, left side: Secondary | ICD-10-CM | POA: Diagnosis not present

## 2018-05-09 DIAGNOSIS — H02134 Senile ectropion of left upper eyelid: Secondary | ICD-10-CM | POA: Diagnosis not present

## 2018-05-09 DIAGNOSIS — H53461 Homonymous bilateral field defects, right side: Secondary | ICD-10-CM | POA: Diagnosis not present

## 2018-05-09 DIAGNOSIS — H02132 Senile ectropion of right lower eyelid: Secondary | ICD-10-CM | POA: Diagnosis not present

## 2018-05-09 DIAGNOSIS — H02834 Dermatochalasis of left upper eyelid: Secondary | ICD-10-CM | POA: Diagnosis not present

## 2018-05-09 DIAGNOSIS — H04523 Eversion of bilateral lacrimal punctum: Secondary | ICD-10-CM | POA: Diagnosis not present

## 2018-05-09 DIAGNOSIS — H0289 Other specified disorders of eyelid: Secondary | ICD-10-CM | POA: Diagnosis not present

## 2018-05-09 DIAGNOSIS — H02831 Dermatochalasis of right upper eyelid: Secondary | ICD-10-CM | POA: Diagnosis not present

## 2018-06-02 ENCOUNTER — Telehealth: Payer: Self-pay | Admitting: *Deleted

## 2018-06-02 NOTE — Telephone Encounter (Signed)
Received Physician Orders from [place]; forwarded to provider/SLS 02/24

## 2018-06-03 ENCOUNTER — Telehealth: Payer: Self-pay | Admitting: *Deleted

## 2018-06-03 NOTE — Telephone Encounter (Signed)
Received Physician Orders from [facility]; forwarded to provider/SLS 02/25

## 2018-06-09 ENCOUNTER — Telehealth: Payer: Self-pay | Admitting: *Deleted

## 2018-06-09 NOTE — Telephone Encounter (Signed)
Received Physician Orders from [facility]; forwarded to provider/SLS 03/02

## 2018-06-20 ENCOUNTER — Telehealth: Payer: Self-pay | Admitting: *Deleted

## 2018-06-20 NOTE — Telephone Encounter (Signed)
Received Physician Orders from [facility]; forwarded to provider/SLS 03/13

## 2018-09-18 ENCOUNTER — Telehealth: Payer: Self-pay

## 2018-09-18 NOTE — Telephone Encounter (Signed)
Copied from [place] [id number]. Topic: General - Other >> Sep 18, 2018 10:04 AM Oneta Rack wrote: Relation to pt: Abel Presto / daughter  Call back number: [phone]   Reason for call:  Daughter requesting PCP increase FLUoxetine (PROZAC). Patient anxiety has increased due to the nursing facility being on "lock down" and patient spouse being considered for hospice, please advise

## 2018-09-22 NOTE — Telephone Encounter (Signed)
OK to increase Fluoxetine to 20 mg tabs, 1 tab po daily disp #30 with 2 rf and if possible set up a phone visit for follow up on changes in about a month or two

## 2018-09-22 NOTE — Telephone Encounter (Signed)
Please advise 

## 2018-09-25 ENCOUNTER — Telehealth: Payer: Self-pay | Admitting: *Deleted

## 2018-09-25 NOTE — Telephone Encounter (Signed)
Copied from [place] [phone]. Topic: General - Inquiry >> Sep 24, 2018  1:52 PM Alanda Slim E wrote: Reason for CRM: Martie Lee from [place] called to check on the status of the Order summary report she faxed to the office on 6.9.20 and needs it faxed back asap. Martie Lee will fax it over again today incase/ please advise  Martie Lee cb# [phone]

## 2018-10-06 NOTE — Telephone Encounter (Signed)
Orders faxed back

## 2018-10-07 MED ORDER — FLUOXETINE HCL 20 MG PO TABS: 20.0000 mg | ORAL_TABLET | Freq: Every day | ORAL | 2 refills | Status: DC

## 2018-10-07 NOTE — Telephone Encounter (Signed)
Left detailed message on daughters voicemail about the medication increase

## 2018-10-07 NOTE — Addendum Note (Signed)
Addended by: Magdalene Molly A on: 10/07/2018 01:19 PM   Modules accepted: Orders

## 2018-10-07 NOTE — Telephone Encounter (Signed)
Pt's daughter want call back from nurse to discuss previous message. Please call

## 2018-10-08 ENCOUNTER — Telehealth: Payer: Self-pay

## 2018-10-08 NOTE — Telephone Encounter (Signed)
Copied from [place] [phone]. Topic: General - Inquiry >> Oct 07, 2018 12:23 PM Virl Axe D wrote: Reason for CRM: Pt's daughter called to speak with Princess regarding pt's health and some paperwork. Stated she reached out 2 weeks ago and had not heard anything back. Please advise. CB#[phone]

## 2018-10-09 NOTE — Telephone Encounter (Signed)
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 Please advise

## 2018-10-16 ENCOUNTER — Other Ambulatory Visit: Payer: Self-pay

## 2018-10-16 ENCOUNTER — Ambulatory Visit (INDEPENDENT_AMBULATORY_CARE_PROVIDER_SITE_OTHER): Payer: PPO | Admitting: Family Medicine

## 2018-10-16 DIAGNOSIS — R197 Diarrhea, unspecified: Secondary | ICD-10-CM | POA: Diagnosis not present

## 2018-10-16 DIAGNOSIS — R0602 Shortness of breath: Secondary | ICD-10-CM | POA: Diagnosis not present

## 2018-10-16 DIAGNOSIS — E7800 Pure hypercholesterolemia, unspecified: Secondary | ICD-10-CM

## 2018-10-16 DIAGNOSIS — E785 Hyperlipidemia, unspecified: Secondary | ICD-10-CM | POA: Diagnosis not present

## 2018-10-16 DIAGNOSIS — I10 Essential (primary) hypertension: Secondary | ICD-10-CM

## 2018-10-16 DIAGNOSIS — E559 Vitamin D deficiency, unspecified: Secondary | ICD-10-CM | POA: Diagnosis not present

## 2018-10-16 DIAGNOSIS — I4891 Unspecified atrial fibrillation: Secondary | ICD-10-CM | POA: Diagnosis not present

## 2018-10-16 DIAGNOSIS — E1121 Type 2 diabetes mellitus with diabetic nephropathy: Secondary | ICD-10-CM

## 2018-10-16 DIAGNOSIS — I2510 Atherosclerotic heart disease of native coronary artery without angina pectoris: Secondary | ICD-10-CM

## 2018-10-16 MED ORDER — POTASSIUM CHLORIDE CRYS ER 20 MEQ PO TBCR: EXTENDED_RELEASE_TABLET | ORAL | 0 refills | Status: DC

## 2018-10-16 MED ORDER — COLESTIPOL HCL 5 G PO GRAN: 5.0000 g | GRANULES | Freq: Two times a day (BID) | ORAL | 12 refills | Status: DC

## 2018-10-16 MED ORDER — POTASSIUM CHLORIDE CRYS ER 20 MEQ PO TBCR: EXTENDED_RELEASE_TABLET | ORAL | 12 refills | Status: DC

## 2018-10-19 DIAGNOSIS — R197 Diarrhea, unspecified: Secondary | ICD-10-CM | POA: Insufficient documentation

## 2018-10-19 NOTE — Assessment & Plan Note (Signed)
Encouraged heart healthy diet, increase exercise, avoid trans fats, consider a krill oil cap daily 

## 2018-10-19 NOTE — Assessment & Plan Note (Signed)
Supplement and monitor 

## 2018-10-19 NOTE — Assessment & Plan Note (Signed)
Is taking daily anti diarrheas and still having 2-3 loose stool tonight. Try Colestid bid and reassess.

## 2018-10-19 NOTE — Assessment & Plan Note (Addendum)
Has palpitations intermittently still with some intermittent SOB. Will check labs will need to follow up with her cardiologist.

## 2018-10-19 NOTE — Progress Notes (Signed)
Virtual Visit via Video Note  I connected with Erin Steele on 10/16/18 at  9:20 AM EDT by a video enabled telemedicine application and verified that I am speaking with the correct person using two identifiers.  Location: Patient: home  Provider: office   I discussed the limitations of evaluation and management by telemedicine and the availability of in person appointments. The patient expressed understanding and agreed to proceed. Princess Eulas Post CMA was able to get the patient set up on a video visit.     Subjective:    Patient ID: Erin Steele, female    DOB: 02-Feb-1928, 85 y.o.   MRN: [id number]  No chief complaint on file.   HPI Patient is in today for follow up on chronic medical concerns including hypertension, diabetes, hyperlipidemia and more. No recent febrile illness bus she continues to have several loose stool daily despite anti diarrhea. No bloody or tarry stool. She is noting fatigue and intermittent palpitations. Chest tightness, sob at times. Notes some trouble with swallowing at times. Denies HA/congestion/fevers or GU c/o. Taking meds as prescribed  Past Medical History:  Diagnosis Date  . A-fib ([place])   . Atrial fibrillation ([place])   . Bilateral rotator cuff dysfunction   . CAD (coronary artery disease)   . CHF (congestive heart failure) ([place])   . Diabetes mellitus with diabetic nephropathy ([place])   . Diabetes mellitus without complication ([place])   . High cholesterol   . HIV infection ([place])   . Hypercalcemia 03/25/2018  . Hyperlipemia   . Hypertension   . Hypokalemia   . Osteoarthritis   . Vitamin D deficiency     Past Surgical History:  Procedure Laterality Date  . ABDOMINAL HYSTERECTOMY    . ANKLE SURGERY     and arms  . APPENDECTOMY    . chicken pox    . intestinal surgery      for cancer  . KIDNEY STONE SURGERY    . measles      Family History  Problem Relation Age of Onset  . COPD Mother        emphysema, nonsmoker  . Heart  disease Sister   . Cancer Sister        lung CA in smoker  . Heart disease Daughter   . Heart disease Maternal Grandmother   . Heart disease Maternal Grandfather   . Heart disease Daughter     Social History   Socioeconomic History  . Marital status: Married    Spouse name: Not on file  . Number of children: Not on file  . Years of education: Not on file  . Highest education level: Not on file  Occupational History  . Occupation: retired  Scientific laboratory technician  . Financial resource strain: Not on file  . Food insecurity    Worry: Not on file    Inability: Not on file  . Transportation needs    Medical: Not on file    Non-medical: Not on file  Tobacco Use  . Smoking status: Never Smoker  . Smokeless tobacco: Never Used  Substance and Sexual Activity  . Alcohol use: Yes    Comment: occasionally, mexican milkshake at Christmas  . Drug use: No  . Sexual activity: Not on file  Lifestyle  . Physical activity    Days per week: Not on file    Minutes per session: Not on file  . Stress: Not on file  Relationships  . Social Herbalist on phone: Not  on file    Gets together: Not on file    Attends religious service: Not on file    Active member of club or organization: Not on file    Attends meetings of clubs or organizations: Not on file    Relationship status: Not on file  . Intimate partner violence    Fear of current or ex partner: Not on file    Emotionally abused: Not on file    Physically abused: Not on file    Forced sexual activity: Not on file  Other Topics Concern  . Not on file  Social History Narrative       was a homemaker with many side jobs   Raised 4 children has relocated here from [facility] to be near family   Lives a [place] with husband   No dietary restrictions    Outpatient Medications Prior to Visit  Medication Sig Dispense Refill  . acetaminophen (TYLENOL) 500 MG tablet Take 500 mg by mouth 3 (three) times daily as needed for mild pain,  moderate pain, fever or headache.    Marland Kitchen apixaban (ELIQUIS) 5 MG TABS tablet Take 5 mg by mouth 2 (two) times daily.    Marland Kitchen BIOFREEZE 4 % GEL Apply topically as needed (shoulders).    . Cholecalciferol 3000 units TABS Take by mouth 2 (two) times daily.    . Cinnamon 500 MG capsule Take by mouth 2 (two) times daily.    . diphenoxylate-atropine (LOMOTIL) 2.5-0.025 MG tablet Take by mouth. 2 tablets with first diarrhea stool, then 1 tablet after each subsequent diarrhea episode no more than 8 per day.    Marland Kitchen FLUoxetine (PROZAC) 20 MG tablet Take 1 tablet (20 mg total) by mouth daily. 30 tablet 2  . furosemide (LASIX) 20 MG tablet Take 20 mg by mouth daily.    Marland Kitchen guaiFENesin-dextromethorphan (ROBITUSSIN DM) 100-10 MG/5ML syrup Take 5 mLs by mouth every 4 (four) hours as needed for cough. 118 mL 0  . levothyroxine (SYNTHROID, LEVOTHROID) 25 MCG tablet Take 25 mcg by mouth daily before breakfast.    . LOSARTAN POTASSIUM PO Take 25 mg by mouth daily.    . metFORMIN (GLUCOPHAGE) 500 MG tablet Take 500 mg by mouth daily.    . metoprolol succinate (TOPROL-XL) 25 MG 24 hr tablet Take 25 mg by mouth daily.    Marland Kitchen POTASSIUM CHLORIDE PO Take 20 mEq by mouth daily.    . pravastatin (PRAVACHOL) 40 MG tablet Take 20 mg by mouth daily.    . methylPREDNISolone (MEDROL) 4 MG tablet 5 tab po qd X 1d then 4 tab po qd X 1d then 3 tab po qd X 1d then 2 tab po qd then 1 tab po qd 15 tablet 0   No facility-administered medications prior to visit.     Allergies  Allergen Reactions  . Morphine And Related     Review of Systems  Constitutional: Positive for malaise/fatigue. Negative for fever.  HENT: Positive for congestion.   Eyes: Negative for blurred vision.  Respiratory: Positive for shortness of breath.   Cardiovascular: Positive for chest pain and palpitations. Negative for leg swelling.  Gastrointestinal: Positive for diarrhea. Negative for abdominal pain, blood in stool, constipation, melena and nausea.   Genitourinary: Negative for dysuria and frequency.  Musculoskeletal: Negative for falls.  Skin: Negative for rash.  Neurological: Negative for dizziness, loss of consciousness and headaches.  Endo/Heme/Allergies: Negative for environmental allergies.  Psychiatric/Behavioral: Negative for depression. The patient is not nervous/anxious.  Objective:    Physical Exam Constitutional:      Appearance: Normal appearance. She is not ill-appearing.  HENT:     Head: Normocephalic and atraumatic.     Nose: Nose normal.  Pulmonary:     Effort: Pulmonary effort is normal.  Neurological:     Mental Status: She is alert and oriented to person, place, and time.  Psychiatric:        Mood and Affect: Mood normal.        Behavior: Behavior normal.     There were no vitals taken for this visit. Wt Readings from Last 3 Encounters:  03/25/18 169 lb (76.7 kg)  03/19/18 167 lb (75.8 kg)  01/23/18 173 lb 12.8 oz (78.8 kg)    Diabetic Foot Exam - Simple   No data filed     Lab Results  Component Value Date   WBC 10.1 03/25/2018   HGB 12.5 03/25/2018   HCT 37.3 03/25/2018   PLT 322.0 03/25/2018   GLUCOSE 110 (H) 03/25/2018   CHOL 81 03/25/2018   TRIG 181.0 (H) 03/25/2018   HDL 32.00 (L) 03/25/2018   LDLCALC 13 03/25/2018   ALT 20 03/25/2018   AST 19 03/25/2018   NA 145 03/25/2018   K 4.5 03/25/2018   CL 114 (H) 03/25/2018   CREATININE 1.03 03/25/2018   BUN 18 03/25/2018   CO2 21 03/25/2018   TSH 2.71 03/25/2018   HGBA1C 6.3 03/25/2018    Lab Results  Component Value Date   TSH 2.71 03/25/2018   Lab Results  Component Value Date   WBC 10.1 03/25/2018   HGB 12.5 03/25/2018   HCT 37.3 03/25/2018   MCV 94.4 03/25/2018   PLT 322.0 03/25/2018   Lab Results  Component Value Date   NA 145 03/25/2018   K 4.5 03/25/2018   CO2 21 03/25/2018   GLUCOSE 110 (H) 03/25/2018   BUN 18 03/25/2018   CREATININE 1.03 03/25/2018   BILITOT 0.3 03/25/2018   ALKPHOS 71 03/25/2018    AST 19 03/25/2018   ALT 20 03/25/2018   PROT 6.8 03/25/2018   ALBUMIN 4.1 03/25/2018   CALCIUM 10.2 03/25/2018   CALCIUM 10.0 03/25/2018   GFR 53.69 (L) 03/25/2018   Lab Results  Component Value Date   CHOL 81 03/25/2018   Lab Results  Component Value Date   HDL 32.00 (L) 03/25/2018   Lab Results  Component Value Date   LDLCALC 13 03/25/2018   Lab Results  Component Value Date   TRIG 181.0 (H) 03/25/2018   Lab Results  Component Value Date   CHOLHDL 3 03/25/2018   Lab Results  Component Value Date   HGBA1C 6.3 03/25/2018       Assessment & Plan:   Problem List Items Addressed This Visit    A-fib ([place])   Relevant Medications   colestipol (COLESTID) 5 g granules   Other Relevant Orders   Ambulatory referral to Cardiology   Hypertension     no changes to meds. Encouraged heart healthy diet such as the DASH diet and exercise as tolerated. She is living at [facility] and being monitored      Relevant Medications   colestipol (COLESTID) 5 g granules   Other Relevant Orders   CBC   Comprehensive metabolic panel   TSH   High cholesterol    Encouraged heart healthy diet, increase exercise, avoid trans fats, consider a krill oil cap daily      Relevant Medications   colestipol (  COLESTID) 5 g granules   Diabetes mellitus with diabetic nephropathy ([place]) - Primary    hgba1c acceptable, minimize simple carbs. Increase exercise as tolerated. Continue current meds      Relevant Orders   Hemoglobin A1c   Vitamin D deficiency    Supplement and monitor      CAD (coronary artery disease)    Has palpitations intermittently still with some intermittent SOB. Will check labs will need to follow up with her cardiologist.       Relevant Medications   colestipol (COLESTID) 5 g granules   Diarrhea    Is taking daily anti diarrheas and still having 2-3 loose stool tonight. Try Colestid bid and reassess.        Other Visit Diagnoses    Hyperlipidemia, unspecified  hyperlipidemia type       Relevant Medications   colestipol (COLESTID) 5 g granules   Other Relevant Orders   Lipid panel   SOB (shortness of breath)       Relevant Orders   DG Chest 2 View      I have discontinued Yecenia Jeffus's methylPREDNISolone. I am also having her maintain her guaiFENesin-dextromethorphan, FLUoxetine, colestipol, potassium chloride SA, POTASSIUM CHLORIDE PO, Cinnamon, Cholecalciferol, Biofreeze, pravastatin, metoprolol succinate, LOSARTAN POTASSIUM PO, furosemide, diphenoxylate-atropine, apixaban, acetaminophen, levothyroxine, and metFORMIN.  Meds ordered this encounter  Medications  . DISCONTD: colestipol (COLESTID) 5 g granules    Sig: Take 5 g by mouth 2 (two) times daily.    Dispense:  500 g    Refill:  12  . colestipol (COLESTID) 5 g granules    Sig: Take 5 g by mouth 2 (two) times daily.    Dispense:  500 g    Refill:  12  . DISCONTD: potassium chloride SA (K-DUR) 20 MEQ tablet    Sig: Take one tablet by mouth daily    Dispense:  90 tablet    Refill:  0  . potassium chloride SA (K-DUR) 20 MEQ tablet    Sig: Take one tablet by mouth daily    Dispense:  90 tablet    Refill:  12      I discussed the assessment and treatment plan with the patient. The patient was provided an opportunity to ask questions and all were answered. The patient agreed with the plan and demonstrated an understanding of the instructions.   The patient was advised to call back or seek an in-person evaluation if the symptoms worsen or if the condition fails to improve as anticipated.  I provided 25 minutes of non-face-to-face time during this encounter.   Penni Homans, MD

## 2018-10-19 NOTE — Assessment & Plan Note (Signed)
hgba1c acceptable, minimize simple carbs. Increase exercise as tolerated. Continue current meds 

## 2018-10-19 NOTE — Assessment & Plan Note (Signed)
no changes to meds. Encouraged heart healthy diet such as the DASH diet and exercise as tolerated. She is living at [facility] and being monitored

## 2018-11-04 ENCOUNTER — Telehealth: Payer: Self-pay

## 2018-11-04 NOTE — Telephone Encounter (Signed)
   Screening was done on both the Pt and the Daughter Willette Cluster) that will be bringing her.    COVID-19 Pre-Screening Questions:  . In the past 7 to 10 days have you had a cough,  shortness of breath, headache, congestion, fever (100 or greater) body aches, chills, sore throat, or sudden loss of taste or sense of smell? NO . Have you been around anyone with known Covid 19. NO . Have you been around anyone who is awaiting Covid 19 test results in the past 7 to 10 days? NO . Have you been around anyone who has been exposed to Covid 19, or has mentioned symptoms of Covid 19 within the past 7 to 10 days? NO  If you have any concerns/questions about symptoms patients report during screening (either on the phone or at threshold). Contact the provider seeing the patient or DOD for further guidance.  If neither are available contact a member of the leadership team.

## 2018-11-04 NOTE — Progress Notes (Signed)
CARDIOLOGY OFFICE NOTE  Date:  11/05/2018    Erin Steele Date of Birth: 04-02-1928 Medical Record #[id number]  PCP:  Mosie Lukes, MD  Cardiologist:  Marlou Porch    Chief Complaint  Patient presents with   Follow-up    Seen for Dr. Marlou Porch    History of Present Illness: Erin Steele is a 85 y.o. female who presents today for a follow up visit. Seen for Dr. Marlou Porch.   She has a history of diabetes, hypertension, hypothyroidism, chronic atrial fibrillation - on Eliquis, moderate mitral regurgitation, nonocclusive CAD and OSA.    She resides at [facility].  Noted past review of medical records from [facility] on 11/02/2015 she had an echocardiogram done in March 2017 that showed EF of 50 to 55%, small PFO, moderate mitral regurgitation and severe tricuspid regurgitation.  She also had moderate pulmonary hypertension.  She has a prior history of carcinoid tumor of the small intestine.  Left heart catheterization performed in July 2017 showed EF of 35 to 40% at the time with mild MR and diffuse minimal plaque throughout coronary arteries and mild pulmonary hypertension. She did not tolerate Entresto and was not willing to retry.   Seen last in October of 2019 by Dr. Marlou Porch - patient was quite homesick - had moved here after daughter's death.   The patient does not have symptoms concerning for COVID-19 infection (fever, chills, cough, or new shortness of breath).   Comes in today. Here with her daughter Erin Steele. Has been "locked up" for the last 5 months. Not happy. Taking care of her 19 year old husband who is now on Hospice. She is complaining of chest tightness - worse about 2 weeks ago. More of a "heavy feeling". Very fatigued as well. Not sleeping due to her husband's issues. No energy. Some swelling in her feet by the end of the day. Weight is actually down. She is convinced she is taking 3 pills of potassium a day - this does not correlate with our record  or the one sent from [facility]. She remains on statin.   Past Medical History:  Diagnosis Date   A-fib [facility])    Atrial fibrillation ([facility])    Bilateral rotator cuff dysfunction    CAD (coronary artery disease)    CHF (congestive heart failure) ([facility])    Diabetes mellitus with diabetic nephropathy ([facility])    Diabetes mellitus without complication ([facility])    High cholesterol    HIV infection ([place])    Hypercalcemia 03/25/2018   Hyperlipemia    Hypertension    Hypokalemia    Osteoarthritis    Vitamin D deficiency     Past Surgical History:  Procedure Laterality Date   ABDOMINAL HYSTERECTOMY     ANKLE SURGERY     and arms   APPENDECTOMY     chicken pox     intestinal surgery      for cancer   KIDNEY STONE SURGERY     measles       Medications: Current Meds  Medication Sig   acetaminophen (TYLENOL) 500 MG tablet Take 500 mg by mouth 3 (three) times daily as needed for mild pain, moderate pain, fever or headache.   apixaban (ELIQUIS) 5 MG TABS tablet Take 5 mg by mouth 2 (two) times daily.   BIOFREEZE 4 % GEL Apply topically as needed (shoulders).   Biotin 10000 MCG TABS Take 1 tablet by mouth daily.   Cholecalciferol 3000 units TABS Take by  mouth 2 (two) times daily.   FLUoxetine (PROZAC) 10 MG tablet Take 10 mg by mouth daily.   furosemide (LASIX) 20 MG tablet Take 20 mg by mouth daily.   levothyroxine (SYNTHROID, LEVOTHROID) 25 MCG tablet Take 25 mcg by mouth daily before breakfast.   loperamide (IMODIUM) 2 MG capsule Take 2 mg by mouth as needed for diarrhea or loose stools.   LOSARTAN POTASSIUM PO Take 25 mg by mouth daily.   metFORMIN (GLUCOPHAGE) 500 MG tablet Take 500 mg by mouth daily.   metoprolol succinate (TOPROL-XL) 25 MG 24 hr tablet Take 25 mg by mouth daily.   ondansetron (ZOFRAN) 4 MG tablet Take 4 mg by mouth every 8 (eight) hours as needed for nausea or vomiting.   Polyvinyl Alcohol-Povidone (REFRESH OP) Apply 1 drop to eye  4 (four) times daily.   potassium chloride SA (K-DUR) 20 MEQ tablet Take one tablet by mouth daily   pravastatin (PRAVACHOL) 20 MG tablet Take 20 mg by mouth daily.     Allergies: Allergies  Allergen Reactions   Morphine And Related Anxiety and Other (See Comments)    Pt is unsure of allergy-02/17/18 Pt is unsure of allergy-02/17/18 Pt is unsure of allergy-02/17/18    Penicillins Other (See Comments)    Doesn't remember reaction Doesn't remember reaction     Social History: The patient  reports that she has never smoked. She has never used smokeless tobacco. She reports current alcohol use. She reports that she does not use drugs.   Family History: The patient's family history includes COPD in her mother; Cancer in her sister; Heart disease in her daughter, daughter, maternal grandfather, maternal grandmother, and sister.   Review of Systems: Please see the history of present illness.   All other systems are reviewed and negative.   Physical Exam: VS:  BP 120/72    Pulse 64    Ht 5\' 4"  (1.626 m)    Wt 160 lb (72.6 kg)    SpO2 96%    BMI 27.46 kg/m  .  BMI Body mass index is 27.46 kg/m.  Wt Readings from Last 3 Encounters:  11/05/18 160 lb (72.6 kg)  03/25/18 169 lb (76.7 kg)  03/19/18 167 lb (75.8 kg)    General: Pleasant. Elderly.  Alert and in no acute distress.   HEENT: Normal.  Neck: Supple, no JVD, carotid bruits, or masses noted.  Cardiac: Irregular irregular rhythm. Rate is ok. No edema.  Respiratory:  Lungs are clear to auscultation bilaterally with normal work of breathing.  GI: Soft and nontender.  MS: No deformity or atrophy. Gait and ROM intact. Unsteady.  Skin: Warm and dry. Color is normal.  Neuro:  Strength and sensation are intact and no gross focal deficits noted.  Psych: Alert, appropriate and with normal affect.   LABORATORY DATA:  EKG:  EKG is ordered today. This shows AF with controlled VR. She has non specific ST and T wave changes -  more pronounced inferiorly but has these as her baseline.  Lab Results  Component Value Date   WBC 10.1 03/25/2018   HGB 12.5 03/25/2018   HCT 37.3 03/25/2018   PLT 322.0 03/25/2018   GLUCOSE 110 (H) 03/25/2018   CHOL 81 03/25/2018   TRIG 181.0 (H) 03/25/2018   HDL 32.00 (L) 03/25/2018   LDLCALC 13 03/25/2018   ALT 20 03/25/2018   AST 19 03/25/2018   NA 145 03/25/2018   K 4.5 03/25/2018   CL 114 (H) 03/25/2018  CREATININE 1.03 03/25/2018   BUN 18 03/25/2018   CO2 21 03/25/2018   TSH 2.71 03/25/2018   HGBA1C 6.3 03/25/2018     BNP (last 3 results) No results for input(s): BNP in the last 8760 hours.  ProBNP (last 3 results) No results for input(s): PROBNP in the last 8760 hours.   Other Studies Reviewed Today:   Assessment/Plan:  1. Chest pain - has known CAD that is non obstructive per cath about 3 years ago - would favor medical/conservative management - I suspect most of her symptoms are more related to stress/caregiver role due to caring for her 68 year old husband and being quarantined - she is not sleeping - weight is down - not getting activity, and clearly admits she is stressed. Needs labs today. May need to consider updating her echo. She is convinced she is taking 3 pills of potassium daily - we will check lab - further disposition to follow.   2. Persistent AF - CHADSVASC score at least 60 (age greater than 67, female, hypertension, CHF, CAD) - managed with rate control and remains on anticoagulation - needs follow up lab today.   3. Chronic anticoagulation - no problems noted - no falls noted. Ok to keep on the 5 mg dose - lab today.   4. Chronic systolic HF - EF during heart catheterization in 2017 was 35 to 40%, most currently in 2017 showed 50 to 55%.  Continue with beta-blocker, angiotensin receptor blocker and Lasix.  She did not tolerate Entresto and did not wish to retry. She is on ARB therapy. It was felt that her systolic dysfunction was out of  proportion to coronary artery disease and possibly related to her atrial fibrillation.  5. Valvular heart disease - moderate MR and severe TR - may need to consider updating her echo and see if this has worsened. This may be the issue but will see what her labs show first.   6. Secondary pulmonary hypertension  7. Diabetes - per PCP  8. HTN - BP fine today. No changes made today.   9. COVID-19 Education: The signs and symptoms of COVID-19 were discussed with the patient and how to seek care for testing (follow up with PCP or arrange E-visit).  The importance of social distancing, staying at home, hand hygiene and wearing a mask when out in public were discussed today.  Current medicines are reviewed with the patient today.  The patient does not have concerns regarding medicines other than what has been noted above.  The following changes have been made:  See above.  Labs/ tests ordered today include:    Orders Placed This Encounter  Procedures   Basic metabolic panel   CBC   TSH   Hepatic function panel   Lipid panel   EKG 12-Lead     Disposition:   FU tentatively with Dr. Marlou Porch in 6 months.   Patient is agreeable to this plan and will call if any problems develop in the interim.   SignedTruitt Merle, NP  11/05/2018 10:31 AM  [facility] [address] [place], [place]  [address] Phone: [phone] Fax: [phone]

## 2018-11-05 ENCOUNTER — Encounter: Payer: Self-pay | Admitting: Nurse Practitioner

## 2018-11-05 ENCOUNTER — Ambulatory Visit (INDEPENDENT_AMBULATORY_CARE_PROVIDER_SITE_OTHER): Payer: PPO | Admitting: Nurse Practitioner

## 2018-11-05 ENCOUNTER — Other Ambulatory Visit: Payer: Self-pay

## 2018-11-05 VITALS — BP 120/72 | HR 64 | Ht 64.0 in | Wt 160.0 lb

## 2018-11-05 DIAGNOSIS — I4821 Permanent atrial fibrillation: Secondary | ICD-10-CM

## 2018-11-05 DIAGNOSIS — Z7901 Long term (current) use of anticoagulants: Secondary | ICD-10-CM | POA: Diagnosis not present

## 2018-11-05 DIAGNOSIS — E7849 Other hyperlipidemia: Secondary | ICD-10-CM

## 2018-11-05 DIAGNOSIS — I5022 Chronic systolic (congestive) heart failure: Secondary | ICD-10-CM | POA: Diagnosis not present

## 2018-11-05 DIAGNOSIS — I2510 Atherosclerotic heart disease of native coronary artery without angina pectoris: Secondary | ICD-10-CM

## 2018-11-05 DIAGNOSIS — Z79899 Other long term (current) drug therapy: Secondary | ICD-10-CM | POA: Diagnosis not present

## 2018-11-05 NOTE — Patient Instructions (Addendum)
After Visit Summary:  We will be checking the following labs today - BMET, CBC, HPF, Lipids and TSH   Medication Instructions:    Continue with your current medicines.    If you need a refill on your cardiac medications before your next appointment, please call your pharmacy.     Testing/Procedures To Be Arranged:  N/A  Follow-Up:   Tentatively see Dr. Marlou Porch in 6 months. Let's see what your labs show - we may end up sending you for a repeat ultrasound of your heart and then back for discussion.     At [facility], you and your health needs are our priority.  As part of our continuing mission to provide you with exceptional heart care, we have created designated Provider Care Teams.  These Care Teams include your primary Cardiologist (physician) and Advanced Practice Providers (APPs -  Physician Assistants and Nurse Practitioners) who all work together to provide you with the care you need, when you need it.  Special Instructions:  . Stay safe, stay home, wash your hands for at least 20 seconds and wear a mask when out in public.  . It was good to talk with you both today.    Call the [place] office at [phone] if you have any questions, problems or concerns.

## 2018-11-06 LAB — BASIC METABOLIC PANEL
BUN/Creatinine Ratio: 17 (ref 12–28)
BUN: 17 mg/dL (ref 8–27)
CO2: 18 mmol/L — ABNORMAL LOW (ref 20–29)
Calcium: 9.6 mg/dL (ref 8.7–10.3)
Chloride: 106 mmol/L (ref 96–106)
Creatinine, Ser: 1.01 mg/dL — ABNORMAL HIGH (ref 0.57–1.00)
GFR calc Af Amer: 57 mL/min/{1.73_m2} — ABNORMAL LOW (ref >59)
GFR calc non Af Amer: 49 mL/min/{1.73_m2} — ABNORMAL LOW (ref >59)
Glucose: 108 mg/dL — ABNORMAL HIGH (ref 65–99)
Potassium: 3.9 mmol/L (ref 3.5–5.2)
Sodium: 141 mmol/L (ref 134–144)

## 2018-11-06 LAB — HEPATIC FUNCTION PANEL
ALT: 13 IU/L (ref 0–32)
AST: 19 IU/L (ref 0–40)
Albumin: 4.6 g/dL (ref 3.6–4.6)
Alkaline Phosphatase: 70 IU/L (ref 39–117)
Bilirubin Total: 0.8 mg/dL (ref 0.0–1.2)
Bilirubin, Direct: 0.24 mg/dL (ref 0.00–0.40)
Total Protein: 6.6 g/dL (ref 6.0–8.5)

## 2018-11-06 LAB — LIPID PANEL
Chol/HDL Ratio: 2.2 ratio (ref 0.0–4.4)
Cholesterol, Total: 95 mg/dL — ABNORMAL LOW (ref 100–199)
HDL: 44 mg/dL (ref >39)
LDL Calculated: 36 mg/dL (ref 0–99)
Triglycerides: 77 mg/dL (ref 0–149)
VLDL Cholesterol Cal: 15 mg/dL (ref 5–40)

## 2018-11-06 LAB — CBC
Hematocrit: 41.2 % (ref 34.0–46.6)
Hemoglobin: 12.9 g/dL (ref 11.1–15.9)
MCH: 30.8 pg (ref 26.6–33.0)
MCHC: 31.3 g/dL — ABNORMAL LOW (ref 31.5–35.7)
MCV: 98 fL — ABNORMAL HIGH (ref 79–97)
Platelets: 268 10*3/uL (ref 150–450)
RBC: 4.19 x10E6/uL (ref 3.77–5.28)
RDW: 13 % (ref 11.7–15.4)
WBC: 8.5 10*3/uL (ref 3.4–10.8)

## 2018-11-06 LAB — TSH: TSH: 4.01 u[IU]/mL (ref 0.450–4.500)

## 2018-11-19 DIAGNOSIS — H02831 Dermatochalasis of right upper eyelid: Secondary | ICD-10-CM | POA: Diagnosis not present

## 2018-11-19 DIAGNOSIS — H02112 Cicatricial ectropion of right lower eyelid: Secondary | ICD-10-CM | POA: Diagnosis not present

## 2018-11-19 DIAGNOSIS — H02132 Senile ectropion of right lower eyelid: Secondary | ICD-10-CM | POA: Diagnosis not present

## 2018-11-19 DIAGNOSIS — H02115 Cicatricial ectropion of left lower eyelid: Secondary | ICD-10-CM | POA: Diagnosis not present

## 2018-11-19 DIAGNOSIS — H02135 Senile ectropion of left lower eyelid: Secondary | ICD-10-CM | POA: Diagnosis not present

## 2018-11-19 DIAGNOSIS — H02834 Dermatochalasis of left upper eyelid: Secondary | ICD-10-CM | POA: Diagnosis not present

## 2018-11-19 DIAGNOSIS — H02106 Unspecified ectropion of left eye, unspecified eyelid: Secondary | ICD-10-CM | POA: Diagnosis not present

## 2018-11-19 DIAGNOSIS — H02103 Unspecified ectropion of right eye, unspecified eyelid: Secondary | ICD-10-CM | POA: Diagnosis not present

## 2019-04-07 ENCOUNTER — Telehealth: Payer: Self-pay | Admitting: Family Medicine

## 2019-04-07 NOTE — Telephone Encounter (Signed)
Form completed and faxed. 

## 2019-04-07 NOTE — Telephone Encounter (Signed)
Copied from [place] [id number]. Topic: General - Other >> Apr 07, 2019 11:45 AM Keene Breath wrote: Reason for CRM: Called to inform the office that she is faxing another FL2 that needs to be filled out asap.  Stated that she had faxed it on 12/16 and still has not gotten a reply.  Please advise and call with any questions at [phone]

## 2019-04-08 NOTE — Telephone Encounter (Signed)
Calling again to state she has never received fax. Requesting form be faxed to: [phone]

## 2019-04-08 NOTE — Telephone Encounter (Signed)
I filled out the form yesterday when I was in the office and it was faxed. I guess try and find it and fax again, confirmt he fax number? Make sure we had the correct one

## 2019-04-09 NOTE — Telephone Encounter (Signed)
Form was faxed with confirmation, then sent to scan

## 2019-04-13 ENCOUNTER — Telehealth: Payer: Self-pay | Admitting: Cardiology

## 2019-04-13 NOTE — Telephone Encounter (Signed)
Daughter states that she will need to come with her mother to her appt with Dr. Marlou Porch on 05/15/19. States patient is hard of hearing, has a hard time understanding sometimes and she has to pick her up from a nursing home.

## 2019-04-14 NOTE — Telephone Encounter (Signed)
Spoke with daughter who reports pt does not comprehend much anymore.  Advised OK to come to appt as long as no COVID s/s, no fever and she wears a mask.  She states understanding and is aware she will be screened before being allowed on the elevator.

## 2019-04-14 NOTE — Telephone Encounter (Signed)
Left message for daughter Willette Cluster (on [place]) to call back to discuss current visitor's policy and the need for her to come in for pt's appt on 2/5.

## 2019-04-24 ENCOUNTER — Ambulatory Visit (INDEPENDENT_AMBULATORY_CARE_PROVIDER_SITE_OTHER): Payer: PPO | Admitting: Family Medicine

## 2019-04-24 ENCOUNTER — Other Ambulatory Visit: Payer: Self-pay

## 2019-04-24 DIAGNOSIS — F32A Depression, unspecified: Secondary | ICD-10-CM

## 2019-04-24 DIAGNOSIS — Z7189 Other specified counseling: Secondary | ICD-10-CM | POA: Diagnosis not present

## 2019-04-24 DIAGNOSIS — F329 Major depressive disorder, single episode, unspecified: Secondary | ICD-10-CM | POA: Diagnosis not present

## 2019-04-24 DIAGNOSIS — E1121 Type 2 diabetes mellitus with diabetic nephropathy: Secondary | ICD-10-CM

## 2019-04-24 DIAGNOSIS — E559 Vitamin D deficiency, unspecified: Secondary | ICD-10-CM | POA: Diagnosis not present

## 2019-04-24 DIAGNOSIS — I4891 Unspecified atrial fibrillation: Secondary | ICD-10-CM

## 2019-04-24 DIAGNOSIS — E7800 Pure hypercholesterolemia, unspecified: Secondary | ICD-10-CM

## 2019-04-24 DIAGNOSIS — R54 Age-related physical debility: Secondary | ICD-10-CM | POA: Diagnosis not present

## 2019-04-24 DIAGNOSIS — I10 Essential (primary) hypertension: Secondary | ICD-10-CM | POA: Diagnosis not present

## 2019-04-24 MED ORDER — FAMOTIDINE 20 MG PO TABS: 20.0000 mg | ORAL_TABLET | Freq: Every day | ORAL | 5 refills | Status: DC

## 2019-04-24 NOTE — Progress Notes (Signed)
Depression

## 2019-04-24 NOTE — Assessment & Plan Note (Signed)
hgba1c acceptable, minimize simple carbs. Increase exercise as tolerated. Continue current meds 

## 2019-04-26 DIAGNOSIS — F32A Depression, unspecified: Secondary | ICD-10-CM | POA: Insufficient documentation

## 2019-04-26 DIAGNOSIS — R54 Age-related physical debility: Secondary | ICD-10-CM | POA: Insufficient documentation

## 2019-04-26 DIAGNOSIS — F329 Major depressive disorder, single episode, unspecified: Secondary | ICD-10-CM | POA: Insufficient documentation

## 2019-04-26 DIAGNOSIS — Z7189 Other specified counseling: Secondary | ICD-10-CM | POA: Insufficient documentation

## 2019-04-26 NOTE — Assessment & Plan Note (Signed)
Asymptomatic, tolerating Eliquis 

## 2019-04-26 NOTE — Progress Notes (Signed)
Patient ID: Erin Steele, female   DOB: [birth year], 86 y.o.   MRN: [id number] Virtual Visit via Video Note  I connected with Erin Steele on 04/24/19 at 10:00 AM EST by a video enabled telemedicine application and verified that I am speaking with the correct person using two identifiers.  Location: Patient: home Provider: home   I discussed the limitations of evaluation and management by telemedicine and the availability of in person appointments. The patient expressed understanding and agreed to proceed. Magdalene Molly, CMA was able to get the patient set up on a visit, video   Subjective:    Patient ID: Erin Steele, female    DOB: 20-Feb-1928, 86 y.o.   MRN: [id number]  Chief Complaint  Patient presents with  . Depression    HPI Patient is in today for follow up. She is now living at [facility] with her husband and is accompanied by staff from her facility for the visit. She feels well today. She does note she was having trouble with right ear and right occiput pain but after putting small amounts of rubbing alcohol in her ear for a couple of days the pain has resolved. No recent febrile illness or hospitalizations. Denies CP/palp/SOB/HA/congestion/fevers/GI or GU c/o. Taking meds as prescribed  Past Medical History:  Diagnosis Date  . A-fib ([place])   . Atrial fibrillation ([place])   . Bilateral rotator cuff dysfunction   . CAD (coronary artery disease)   . CHF (congestive heart failure) ([place])   . Diabetes mellitus with diabetic nephropathy ([place])   . Diabetes mellitus without complication ([place])   . High cholesterol   . HIV infection ([place])   . Hypercalcemia 03/25/2018  . Hyperlipemia   . Hypertension   . Hypokalemia   . Osteoarthritis   . Vitamin D deficiency     Past Surgical History:  Procedure Laterality Date  . ABDOMINAL HYSTERECTOMY    . ANKLE SURGERY     and arms  . APPENDECTOMY    . chicken pox    . intestinal surgery      for cancer  . KIDNEY  STONE SURGERY    . measles      Family History  Problem Relation Age of Onset  . COPD Mother        emphysema, nonsmoker  . Heart disease Sister   . Cancer Sister        lung CA in smoker  . Heart disease Daughter   . Heart disease Maternal Grandmother   . Heart disease Maternal Grandfather   . Heart disease Daughter     Social History   Socioeconomic History  . Marital status: Married    Spouse name: Not on file  . Number of children: Not on file  . Years of education: Not on file  . Highest education level: Not on file  Occupational History  . Occupation: retired  Tobacco Use  . Smoking status: Never Smoker  . Smokeless tobacco: Never Used  Substance and Sexual Activity  . Alcohol use: Yes    Comment: occasionally, mexican milkshake at Christmas  . Drug use: No  . Sexual activity: Not on file  Other Topics Concern  . Not on file  Social History Narrative       was a homemaker with many side jobs   Raised 4 children has relocated here from [facility] to be near family   Lives a [place] with husband   No dietary restrictions   Social Determinants of Health  Financial Resource Strain:   . Difficulty of Paying Living Expenses: Not on file  Food Insecurity:   . Worried About Charity fundraiser in the Last Year: Not on file  . Ran Out of Food in the Last Year: Not on file  Transportation Needs:   . Lack of Transportation (Medical): Not on file  . Lack of Transportation (Non-Medical): Not on file  Physical Activity:   . Days of Exercise per Week: Not on file  . Minutes of Exercise per Session: Not on file  Stress:   . Feeling of Stress : Not on file  Social Connections:   . Frequency of Communication with Friends and Family: Not on file  . Frequency of Social Gatherings with Friends and Family: Not on file  . Attends Religious Services: Not on file  . Active Member of Clubs or Organizations: Not on file  . Attends Archivist Meetings: Not on file    . Marital Status: Not on file  Intimate Partner Violence:   . Fear of Current or Ex-Partner: Not on file  . Emotionally Abused: Not on file  . Physically Abused: Not on file  . Sexually Abused: Not on file    Outpatient Medications Prior to Visit  Medication Sig Dispense Refill  . acetaminophen (TYLENOL) 500 MG tablet Take 500 mg by mouth 3 (three) times daily as needed for mild pain, moderate pain, fever or headache.    Marland Kitchen apixaban (ELIQUIS) 5 MG TABS tablet Take 5 mg by mouth 2 (two) times daily.    Marland Kitchen BIOFREEZE 4 % GEL Apply topically as needed (shoulders).    . Biotin 10000 MCG TABS Take 1 tablet by mouth daily.    . Cholecalciferol 3000 units TABS Take by mouth 2 (two) times daily.    Marland Kitchen FLUoxetine (PROZAC) 10 MG tablet Take 10 mg by mouth daily.    . furosemide (LASIX) 20 MG tablet Take 20 mg by mouth daily.    Marland Kitchen levothyroxine (SYNTHROID, LEVOTHROID) 25 MCG tablet Take 25 mcg by mouth daily before breakfast.    . loperamide (IMODIUM) 2 MG capsule Take 2 mg by mouth as needed for diarrhea or loose stools.    Marland Kitchen LOSARTAN POTASSIUM PO Take 25 mg by mouth daily.    . metFORMIN (GLUCOPHAGE) 500 MG tablet Take 500 mg by mouth daily.    . metoprolol succinate (TOPROL-XL) 25 MG 24 hr tablet Take 25 mg by mouth daily.    . ondansetron (ZOFRAN) 4 MG tablet Take 4 mg by mouth every 8 (eight) hours as needed for nausea or vomiting.    . Polyvinyl Alcohol-Povidone (REFRESH OP) Apply 1 drop to eye 4 (four) times daily.    . potassium chloride SA (K-DUR) 20 MEQ tablet Take one tablet by mouth daily 90 tablet 12  . pravastatin (PRAVACHOL) 20 MG tablet Take 20 mg by mouth daily.     No facility-administered medications prior to visit.    Allergies  Allergen Reactions  . Morphine And Related Anxiety and Other (See Comments)    Pt is unsure of allergy-02/17/18 Pt is unsure of allergy-02/17/18 Pt is unsure of allergy-02/17/18   . Penicillins Other (See Comments)    Doesn't remember  reaction Doesn't remember reaction     Review of Systems  Constitutional: Positive for malaise/fatigue. Negative for fever.  HENT: Negative for congestion.   Eyes: Negative for blurred vision.  Respiratory: Negative for shortness of breath.   Cardiovascular: Negative for chest pain, palpitations  and leg swelling.  Gastrointestinal: Negative for abdominal pain, blood in stool and nausea.  Genitourinary: Negative for dysuria and frequency.  Musculoskeletal: Negative for falls.  Skin: Negative for rash.  Neurological: Negative for dizziness, loss of consciousness and headaches.  Endo/Heme/Allergies: Negative for environmental allergies.  Psychiatric/Behavioral: Negative for depression. The patient is not nervous/anxious.        Objective:    Physical Exam Constitutional:      Appearance: Normal appearance. She is not ill-appearing.  HENT:     Head: Normocephalic and atraumatic.     Nose: Nose normal.  Eyes:     General:        Right eye: No discharge.        Left eye: No discharge.  Pulmonary:     Effort: Pulmonary effort is normal.  Neurological:     Mental Status: She is alert and oriented to person, place, and time.  Psychiatric:        Behavior: Behavior normal.     BP 136/73   Temp 97.6 F (36.4 C)   Resp 18   SpO2 98%  Wt Readings from Last 3 Encounters:  11/05/18 160 lb (72.6 kg)  03/25/18 169 lb (76.7 kg)  03/19/18 167 lb (75.8 kg)    Diabetic Foot Exam - Simple   No data filed     Lab Results  Component Value Date   WBC 8.5 11/05/2018   HGB 12.9 11/05/2018   HCT 41.2 11/05/2018   PLT 268 11/05/2018   GLUCOSE 108 (H) 11/05/2018   CHOL 95 (L) 11/05/2018   TRIG 77 11/05/2018   HDL 44 11/05/2018   LDLCALC 36 11/05/2018   ALT 13 11/05/2018   AST 19 11/05/2018   NA 141 11/05/2018   K 3.9 11/05/2018   CL 106 11/05/2018   CREATININE 1.01 (H) 11/05/2018   BUN 17 11/05/2018   CO2 18 (L) 11/05/2018   TSH 4.010 11/05/2018   HGBA1C 6.3 03/25/2018     Lab Results  Component Value Date   TSH 4.010 11/05/2018   Lab Results  Component Value Date   WBC 8.5 11/05/2018   HGB 12.9 11/05/2018   HCT 41.2 11/05/2018   MCV 98 (H) 11/05/2018   PLT 268 11/05/2018   Lab Results  Component Value Date   NA 141 11/05/2018   K 3.9 11/05/2018   CO2 18 (L) 11/05/2018   GLUCOSE 108 (H) 11/05/2018   BUN 17 11/05/2018   CREATININE 1.01 (H) 11/05/2018   BILITOT 0.8 11/05/2018   ALKPHOS 70 11/05/2018   AST 19 11/05/2018   ALT 13 11/05/2018   PROT 6.6 11/05/2018   ALBUMIN 4.6 11/05/2018   CALCIUM 9.6 11/05/2018   GFR 53.69 (L) 03/25/2018   Lab Results  Component Value Date   CHOL 95 (L) 11/05/2018   Lab Results  Component Value Date   HDL 44 11/05/2018   Lab Results  Component Value Date   LDLCALC 36 11/05/2018   Lab Results  Component Value Date   TRIG 77 11/05/2018   Lab Results  Component Value Date   CHOLHDL 2.2 11/05/2018   Lab Results  Component Value Date   HGBA1C 6.3 03/25/2018       Assessment & Plan:   Problem List Items Addressed This Visit    A-fib ([facility])    Asymptomatic, tolerating Eliquis      Hypertension    Well controlled, no changes to meds. Encouraged heart healthy diet such as the DASH diet  and exercise as tolerated.       High cholesterol    Encouraged heart healthy diet, increase exercise, avoid trans fats, consider a krill oil cap daily, tolerating Pravastatin      Diabetes mellitus with diabetic nephropathy ([facility])    hgba1c acceptable, minimize simple carbs. Increase exercise as tolerated. Continue current meds      Vitamin D deficiency    Supplement and monitor      Age-related physical debility    She is now living at [facility] with her much more debilitated husband for whom she helps to care. They report she is doing well over all and will draw lab work for [place] next week      Depression    Stable on Fluoxetine 10 mg daily, will continue to prescribe.       Educated about  COVID-19 virus infection    She had refused the COVID 19 vaccine at her facility but after discussion today, she has agreed to take it. Her facility will facilitate the administration at their next immunization clinic in February         I am having Erin Steele start on famotidine. I am also having her maintain her potassium chloride SA, FLUoxetine, pravastatin, ondansetron, loperamide, Polyvinyl Alcohol-Povidone (REFRESH OP), Biotin, Cholecalciferol, Biofreeze, metoprolol succinate, LOSARTAN POTASSIUM PO, furosemide, apixaban, acetaminophen, levothyroxine, and metFORMIN.  Meds ordered this encounter  Medications  . famotidine (PEPCID) 20 MG tablet    Sig: Take 1 tablet (20 mg total) by mouth at bedtime.    Dispense:  30 tablet    Refill:  5     I discussed the assessment and treatment plan with the patient. The patient was provided an opportunity to ask questions and all were answered. The patient agreed with the plan and demonstrated an understanding of the instructions.   The patient was advised to call back or seek an in-person evaluation if the symptoms worsen or if the condition fails to improve as anticipated.  I provided 25 minutes of non-face-to-face time during this encounter.   Penni Homans, MD

## 2019-04-26 NOTE — Assessment & Plan Note (Signed)
Supplement and monitor 

## 2019-04-26 NOTE — Assessment & Plan Note (Signed)
She had refused the COVID 19 vaccine at her facility but after discussion today, she has agreed to take it. Her facility will facilitate the administration at their next immunization clinic in February

## 2019-04-26 NOTE — Assessment & Plan Note (Signed)
She is now living at [facility] with her much more debilitated husband for whom she helps to care. They report she is doing well over all and will draw lab work for [place] next week

## 2019-04-26 NOTE — Assessment & Plan Note (Signed)
Well controlled, no changes to meds. Encouraged heart healthy diet such as the DASH diet and exercise as tolerated.  °

## 2019-04-26 NOTE — Assessment & Plan Note (Signed)
Stable on Fluoxetine 10 mg daily, will continue to prescribe.

## 2019-04-26 NOTE — Assessment & Plan Note (Addendum)
Encouraged heart healthy diet, increase exercise, avoid trans fats, consider a krill oil cap daily, tolerating Pravastatin

## 2019-04-27 MED ORDER — FAMOTIDINE 20 MG PO TABS: 20.0000 mg | ORAL_TABLET | Freq: Every day | ORAL | 5 refills | Status: DC

## 2019-04-27 NOTE — Addendum Note (Signed)
Addended by: Magdalene Molly A on: 04/27/2019 03:59 PM   Modules accepted: Orders

## 2019-04-28 ENCOUNTER — Telehealth: Payer: Self-pay | Admitting: Family Medicine

## 2019-04-28 NOTE — Telephone Encounter (Signed)
[facility] sent orders that need to be signed, they have also stated they do not have lab services at the facility and the patient will need to come in the office to have labs drawn  Please advise

## 2019-04-28 NOTE — Telephone Encounter (Signed)
Caller Name: [place] w/[facility] Phone: [phone]  [place] said the med list is different than what they have ordered for the pt and requesting call to clarify. Also, they are no able to have labs drawn and pt will need to come in office. Requesting call from [facility].

## 2019-04-28 NOTE — Telephone Encounter (Signed)
They told me they could draw labs but if no then set her up with a lab appt if she is willing and once the orders get to me I will sign

## 2019-04-30 NOTE — Telephone Encounter (Signed)
Please schedule patient a lab appt. Labs have been ordered  Please advise

## 2019-05-01 ENCOUNTER — Telehealth: Payer: Self-pay

## 2019-05-01 NOTE — Telephone Encounter (Signed)
Will refax.

## 2019-05-01 NOTE — Telephone Encounter (Signed)
A lady by the name of [place] called  from [place]  about some orders that was faxed over for the patient by the name of  Erin Steele, 86 y.o., [birth year] MRN: [id number] Phone: [phone] Can you please give [place] a call back today at [phone]

## 2019-05-07 NOTE — Telephone Encounter (Signed)
LM for pt to call and schedule lab appt °

## 2019-05-15 ENCOUNTER — Telehealth: Payer: Self-pay | Admitting: Family Medicine

## 2019-05-15 ENCOUNTER — Ambulatory Visit: Payer: PPO | Admitting: Cardiology

## 2019-05-15 MED ORDER — APIXABAN 5 MG PO TABS: 5.0000 mg | ORAL_TABLET | Freq: Two times a day (BID) | ORAL | 0 refills | Status: DC

## 2019-05-15 NOTE — Telephone Encounter (Signed)
Patient's daughter Jamey Ripa) @[phone] called in this morning, states that patient is in an assisted living home and patient left town to attended her husband's funeral in [place] , the assisted living home did not send another apixaban (ELIQUIS) 5 MG TABS tablet [id number]   With patient .  Patient's daughter would like for enough pills sent to a pharmacy in [facility]  [facility]    [address], [place], [place] [address] [phone]

## 2019-05-15 NOTE — Telephone Encounter (Signed)
Medication sent in. 

## 2019-06-10 ENCOUNTER — Ambulatory Visit: Payer: PPO | Admitting: Cardiology

## 2019-06-10 ENCOUNTER — Other Ambulatory Visit: Payer: PPO

## 2019-06-10 ENCOUNTER — Telehealth: Payer: Self-pay

## 2019-06-10 NOTE — Telephone Encounter (Signed)
Received fax from [facility] regarding pts weight.  [employer], inaccurate information on fax.  Patient weighed today, weight is appropriate.

## 2019-06-17 ENCOUNTER — Telehealth: Payer: Self-pay | Admitting: Family Medicine

## 2019-06-17 ENCOUNTER — Telehealth: Payer: Self-pay | Admitting: *Deleted

## 2019-06-17 NOTE — Telephone Encounter (Signed)
Received a form for independent assessement for personal care services attestation of medical need.  Dr. Charlett Blake needs to know if for step 4 on the form if the 2nd and 3rd options applies to patient.  Left message for JJ or [place] to call back.  They are the nurses for today.

## 2019-06-17 NOTE — Telephone Encounter (Signed)
Spoke with [place] and form filled out.

## 2019-06-23 ENCOUNTER — Encounter (INDEPENDENT_AMBULATORY_CARE_PROVIDER_SITE_OTHER): Payer: Self-pay

## 2019-06-23 ENCOUNTER — Ambulatory Visit (INDEPENDENT_AMBULATORY_CARE_PROVIDER_SITE_OTHER): Payer: PPO | Admitting: Cardiology

## 2019-06-23 ENCOUNTER — Encounter: Payer: Self-pay | Admitting: Cardiology

## 2019-06-23 ENCOUNTER — Other Ambulatory Visit (INDEPENDENT_AMBULATORY_CARE_PROVIDER_SITE_OTHER): Payer: PPO

## 2019-06-23 ENCOUNTER — Other Ambulatory Visit: Payer: Self-pay

## 2019-06-23 VITALS — BP 124/86 | HR 73 | Ht 64.0 in | Wt 158.2 lb

## 2019-06-23 DIAGNOSIS — Z79899 Other long term (current) drug therapy: Secondary | ICD-10-CM

## 2019-06-23 DIAGNOSIS — I5022 Chronic systolic (congestive) heart failure: Secondary | ICD-10-CM | POA: Diagnosis not present

## 2019-06-23 DIAGNOSIS — I10 Essential (primary) hypertension: Secondary | ICD-10-CM | POA: Diagnosis not present

## 2019-06-23 DIAGNOSIS — E785 Hyperlipidemia, unspecified: Secondary | ICD-10-CM

## 2019-06-23 DIAGNOSIS — I4821 Permanent atrial fibrillation: Secondary | ICD-10-CM

## 2019-06-23 DIAGNOSIS — E1121 Type 2 diabetes mellitus with diabetic nephropathy: Secondary | ICD-10-CM

## 2019-06-23 DIAGNOSIS — I2510 Atherosclerotic heart disease of native coronary artery without angina pectoris: Secondary | ICD-10-CM | POA: Diagnosis not present

## 2019-06-23 DIAGNOSIS — Z7901 Long term (current) use of anticoagulants: Secondary | ICD-10-CM | POA: Diagnosis not present

## 2019-06-23 LAB — HEMOGLOBIN A1C: Hgb A1c MFr Bld: 6 % (ref 4.6–6.5)

## 2019-06-23 LAB — LIPID PANEL
Cholesterol: 96 mg/dL (ref 0–200)
HDL: 42.6 mg/dL (ref >39.00)
LDL Cholesterol: 39 mg/dL (ref 0–99)
NonHDL: 53.62
Total CHOL/HDL Ratio: 2
Triglycerides: 72 mg/dL (ref 0.0–149.0)
VLDL: 14.4 mg/dL (ref 0.0–40.0)

## 2019-06-23 LAB — COMPREHENSIVE METABOLIC PANEL
ALT: 9 U/L (ref 0–35)
AST: 16 U/L (ref 0–37)
Albumin: 4.2 g/dL (ref 3.5–5.2)
Alkaline Phosphatase: 55 U/L (ref 39–117)
BUN: 21 mg/dL (ref 6–23)
CO2: 25 mEq/L (ref 19–32)
Calcium: 9.8 mg/dL (ref 8.4–10.5)
Chloride: 107 mEq/L (ref 96–112)
Creatinine, Ser: 0.96 mg/dL (ref 0.40–1.20)
GFR: 54.63 mL/min — ABNORMAL LOW (ref >60.00)
Glucose, Bld: 101 mg/dL — ABNORMAL HIGH (ref 70–99)
Potassium: 4 mEq/L (ref 3.5–5.1)
Sodium: 139 mEq/L (ref 135–145)
Total Bilirubin: 1 mg/dL (ref 0.2–1.2)
Total Protein: 6.7 g/dL (ref 6.0–8.3)

## 2019-06-23 LAB — CBC
HCT: 37.6 % (ref 36.0–46.0)
Hemoglobin: 12.6 g/dL (ref 12.0–15.0)
MCHC: 33.6 g/dL (ref 30.0–36.0)
MCV: 94.9 fl (ref 78.0–100.0)
Platelets: 235 10*3/uL (ref 150.0–400.0)
RBC: 3.96 Mil/uL (ref 3.87–5.11)
RDW: 13.8 % (ref 11.5–15.5)
WBC: 7.8 10*3/uL (ref 4.0–10.5)

## 2019-06-23 LAB — TSH: TSH: 2.85 u[IU]/mL (ref 0.35–4.50)

## 2019-06-23 NOTE — Patient Instructions (Signed)
Medication Instructions:   Your physician recommends that you continue on your current medications as directed. Please refer to the Current Medication list given to you today.  *If you need a refill on your cardiac medications before your next appointment, please call your pharmacy*  Follow-Up:  [place] NP IN THE OFFICE--SCHEDULING PLEASE GO AHEAD AND ARRANGE THIS APPOINTMENT TODAY PER DR. Marlou Porch   At [facility], you and your health needs are our priority.  As part of our continuing mission to provide you with exceptional heart care, we have created designated Provider Care Teams.  These Care Teams include your primary Cardiologist (physician) and Advanced Practice Providers (APPs -  Physician Assistants and Nurse Practitioners) who all work together to provide you with the care you need, when you need it.  We recommend signing up for the patient portal called "MyChart".  Sign up information is provided on this After Visit Summary.  MyChart is used to connect with patients for Virtual Visits (Telemedicine).  Patients are able to view lab/test results, encounter notes, upcoming appointments, etc.  Non-urgent messages can be sent to your provider as well.   To learn more about what you can do with MyChart, go to [email or web address].    Your next appointment:   12 month(s)  The format for your next appointment:   In Person  Provider:   Candee Furbish, MD

## 2019-06-23 NOTE — Progress Notes (Signed)
Cardiology Office Note:    Date:  06/23/2019   ID:  Erin Steele, DOB 1927-12-29, MRN [id number]  PCP:  Mosie Lukes, MD  Cardiologist:  Candee Furbish, MD  Electrophysiologist:  None   Referring MD: Mosie Lukes, MD     History of Present Illness:    Erin Steele is a 86 y.o. female here for the follow-up of persistent atrial fibrillation on Eliquis with moderate mitral gravitation nonocclusive CAD and obstructive sleep apnea with diabetes hypertension hypothyroidism.  Lives at [place].  Moved to town to be with her daughter.  2017 -Echo 50 to 55% small PFO moderate mitral vegetation and severe tricuspid regurgitation moderate pulmonary hypertension.  Carcinoid tumor of small intestine prior history  Left heart catheterization 2017 -EF 35 to 40% with mild MR nonobstructive CAD.  Mild pulmonary hypertension. -At that time did not tolerate Entresto  Last time with Cecille Rubin, her 34 year old husband was on hospice.  She complained of occasional chest tightness heaviness fatigue not sleeping well no energy minor lower extremity edema at the end of the day.  She thought she was taking 3 pills of potassium a day but this did not correlate with records from [place] previously.  Continues with statin use.  She still continues to have occasional chest discomfort.  She was given advice by her prior cardiologist to keep moving and if shortness of breath or pain were to occur, to sit down.  Her husband did die in January 2021.  There was a lot of stress surrounding all of this.  They were married for 72 years.  She seems to be keeping up with her medications quite well, lets her assistance know when Eliquis is running low.   Past Medical History:  Diagnosis Date  . A-fib ([place])   . Atrial fibrillation ([place])   . Bilateral rotator cuff dysfunction   . CAD (coronary artery disease)   . CHF (congestive heart failure) ([place])   . Diabetes mellitus with diabetic nephropathy ([place])    . Diabetes mellitus without complication ([place])   . High cholesterol   . HIV infection ([place])   . Hypercalcemia 03/25/2018  . Hyperlipemia   . Hypertension   . Hypokalemia   . Osteoarthritis   . Vitamin D deficiency     Past Surgical History:  Procedure Laterality Date  . ABDOMINAL HYSTERECTOMY    . ANKLE SURGERY     and arms  . APPENDECTOMY    . chicken pox    . intestinal surgery      for cancer  . KIDNEY STONE SURGERY    . measles      Current Medications: Current Meds  Medication Sig  . acetaminophen (TYLENOL) 500 MG tablet Take 500 mg by mouth 3 (three) times daily as needed for mild pain, moderate pain, fever or headache.  Marland Kitchen apixaban (ELIQUIS) 5 MG TABS tablet Take 5 mg by mouth 2 (two) times daily.  Marland Kitchen apixaban (ELIQUIS) 5 MG TABS tablet Take 1 tablet (5 mg total) by mouth 2 (two) times daily.  Marland Kitchen BIOFREEZE 4 % GEL Apply topically as needed (shoulders).  . Cholecalciferol 3000 units TABS Take by mouth 2 (two) times daily.  . famotidine (PEPCID) 20 MG tablet Take 1 tablet (20 mg total) by mouth at bedtime.  Marland Kitchen FLUoxetine (PROZAC) 20 MG tablet Take 20 mg by mouth daily.  . furosemide (LASIX) 20 MG tablet Take 20 mg by mouth daily.  Marland Kitchen levothyroxine (SYNTHROID, LEVOTHROID) 25 MCG tablet Take 25 mcg  by mouth daily before breakfast.  . loperamide (IMODIUM) 2 MG capsule Take 2 mg by mouth as needed for diarrhea or loose stools.  Marland Kitchen losartan (COZAAR) 25 MG tablet Take 25 mg by mouth daily.  . metFORMIN (GLUCOPHAGE-XR) 500 MG 24 hr tablet Take 500 mg by mouth daily.  . metoprolol succinate (TOPROL-XL) 25 MG 24 hr tablet Take 25 mg by mouth daily.  . ondansetron (ZOFRAN) 4 MG tablet Take 4 mg by mouth every 8 (eight) hours as needed for nausea or vomiting.  . Polyvinyl Alcohol-Povidone (REFRESH OP) Apply 1 drop to eye 4 (four) times daily.  . potassium chloride SA (K-DUR) 20 MEQ tablet Take one tablet by mouth daily  . pravastatin (PRAVACHOL) 20 MG tablet Take 20 mg by mouth  daily.  . [DISCONTINUED] FLUoxetine (PROZAC) 10 MG tablet Take 10 mg by mouth daily.     Allergies:   Morphine and related and Penicillins   Social History   Socioeconomic History  . Marital status: Married    Spouse name: Not on file  . Number of children: Not on file  . Years of education: Not on file  . Highest education level: Not on file  Occupational History  . Occupation: retired  Tobacco Use  . Smoking status: Never Smoker  . Smokeless tobacco: Never Used  Substance and Sexual Activity  . Alcohol use: Yes    Comment: occasionally, mexican milkshake at Christmas  . Drug use: No  . Sexual activity: Not on file  Other Topics Concern  . Not on file  Social History Narrative       was a homemaker with many side jobs   Raised 4 children has relocated here from [facility] to be near family   Lives a [place] with husband   No dietary restrictions   Social Determinants of Radio broadcast assistant Strain:   . Difficulty of Paying Living Expenses:   Food Insecurity:   . Worried About Charity fundraiser in the Last Year:   . Arboriculturist in the Last Year:   Transportation Needs:   . Film/video editor (Medical):   Marland Kitchen Lack of Transportation (Non-Medical):   Physical Activity:   . Days of Exercise per Week:   . Minutes of Exercise per Session:   Stress:   . Feeling of Stress :   Social Connections:   . Frequency of Communication with Friends and Family:   . Frequency of Social Gatherings with Friends and Family:   . Attends Religious Services:   . Active Member of Clubs or Organizations:   . Attends Archivist Meetings:   Marland Kitchen Marital Status:      Family History: The patient's family history includes COPD in her mother; Cancer in her sister; Heart disease in her daughter, daughter, maternal grandfather, maternal grandmother, and sister.  ROS:   Please see the history of present illness.    Positive for peripheral neuropathy, disequilibrium, no  bleeding no fevers no chills no syncope all other systems reviewed and are negative.  EKGs/Labs/Other Studies Reviewed:    The following studies were reviewed today: As above  EKG:  EKG is not ordered today.    Recent Labs: 11/05/2018: ALT 13; BUN 17; Creatinine, Ser 1.01; Hemoglobin 12.9; Platelets 268; Potassium 3.9; Sodium 141; TSH 4.010  Recent Lipid Panel    Component Value Date/Time   CHOL 95 (L) 11/05/2018 1049   TRIG 77 11/05/2018 1049   HDL 44 11/05/2018  1049   CHOLHDL 2.2 11/05/2018 1049   CHOLHDL 3 03/25/2018 1559   VLDL 36.2 03/25/2018 1559   LDLCALC 36 11/05/2018 1049    Physical Exam:    VS:  BP 124/86   Pulse 73   Ht 5\' 4"  (1.626 m)   Wt 158 lb 3.2 oz (71.8 kg)   SpO2 97%   BMI 27.15 kg/m     Wt Readings from Last 3 Encounters:  06/23/19 158 lb 3.2 oz (71.8 kg)  11/05/18 160 lb (72.6 kg)  03/25/18 169 lb (76.7 kg)     GEN: Elderly, utilizing cane well nourished, well developed in no acute distress HEENT: Normal NECK: No JVD; No carotid bruits LYMPHATICS: No lymphadenopathy CARDIAC: Irregularly irregular, no murmurs heard despite tricuspid and mitral regurgitation, no rubs, gallops RESPIRATORY:  Clear to auscultation without rales, wheezing or rhonchi  ABDOMEN: Soft, non-tender, non-distended MUSCULOSKELETAL:  No significant ankle edema; No deformity  SKIN: Warm and dry NEUROLOGIC:  Alert and oriented x 3 PSYCHIATRIC:  Normal affect   ASSESSMENT:    1. Permanent atrial fibrillation ([place])   2. Coronary artery disease involving native coronary artery of native heart without angina pectoris   3. Chronic systolic heart failure ([place])   4. High risk medication use   5. Chronic anticoagulation    PLAN:    In order of problems listed above:  Persistent atrial fibrillation -CHA2DS2-VASc 6 (age greater than 40, female, hypertension, systolic heart failure, CAD) -Rate controlled continue to monitor both hemoglobin and creatinine.  Chronic  anticoagulation -Eliquis 5 mg a day.  She has had a few falls, nothing catastrophic.  She understands that if she does hit her head significantly, to seek medical attention  Chronic systolic heart failure -EF has improved to 50 to 55% -Did not tolerate Entresto. -Continue with beta-blocker angiotensin receptor blocker and Lasix. -Atrial fibrillation may have been related to her heart failure.  Moderate mitral regurgitation, severe tricuspid regurgitation -We are continuing to monitor clinically.  Prior echo report from April 2019 reviewed from [facility].  No changes made to medications.  Her daughter was present to help with historical items.  Medication Adjustments/Labs and Tests Ordered: Current medicines are reviewed at length with the patient today.  Concerns regarding medicines are outlined above.  No orders of the defined types were placed in this encounter.  No orders of the defined types were placed in this encounter.   Patient Instructions  Medication Instructions:   Your physician recommends that you continue on your current medications as directed. Please refer to the Current Medication list given to you today.  *If you need a refill on your cardiac medications before your next appointment, please call your pharmacy*  Follow-Up:  [place] NP IN THE OFFICE--SCHEDULING PLEASE GO AHEAD AND ARRANGE THIS APPOINTMENT TODAY PER DR. Marlou Porch   At [facility], you and your health needs are our priority.  As part of our continuing mission to provide you with exceptional heart care, we have created designated Provider Care Teams.  These Care Teams include your primary Cardiologist (physician) and Advanced Practice Providers (APPs -  Physician Assistants and Nurse Practitioners) who all work together to provide you with the care you need, when you need it.  We recommend signing up for the patient portal called "MyChart".  Sign up information is provided  on this After Visit Summary.  MyChart is used to connect with patients for Virtual Visits (Telemedicine).  Patients are able to view  lab/test results, encounter notes, upcoming appointments, etc.  Non-urgent messages can be sent to your provider as well.   To learn more about what you can do with MyChart, go to [email or web address].    Your next appointment:   12 month(s)  The format for your next appointment:   In Person  Provider:   Candee Furbish, MD       Signed, Candee Furbish, MD  06/23/2019 9:45 AM    [place]

## 2019-07-15 ENCOUNTER — Telehealth: Payer: Self-pay | Admitting: *Deleted

## 2019-07-15 NOTE — Telephone Encounter (Signed)
Daughter Erin Steele sent over a form (Examination for Housebound Status or Permanent Need for Regular Aid and Attendance).  She has filled in your portion of the form.  I have put the form in your yellow folder along with last note for your review.  I will be happy to call daughter to get blank form if needed.

## 2019-07-15 NOTE — Telephone Encounter (Signed)
No can use the one they brought but turns out I have meetings tomorrow so will not be back in office til Monday

## 2019-07-15 NOTE — Telephone Encounter (Signed)
Daughter notified that provider will be out of the office til Monday and she is ok with that.

## 2019-07-17 NOTE — Telephone Encounter (Signed)
Left message on machine that Dr. Charlett Blake will be back on Tuesday and not Monday.

## 2019-07-22 NOTE — Telephone Encounter (Signed)
Spoke with daughter and paperwork faxed back to her.

## 2019-09-14 ENCOUNTER — Telehealth: Payer: Self-pay | Admitting: Family Medicine

## 2019-09-14 NOTE — Progress Notes (Signed)
°  Chronic Care Management   Outreach Note  09/14/2019 Name: Paetyn Pietrzak MRN: [id number] DOB: 1927-05-25  Referred by: Mosie Lukes, MD Reason for referral : No chief complaint on file.   An unsuccessful telephone outreach was attempted today. The patient was referred to the pharmacist for assistance with care management and care coordination.   This note is not being shared with the patient for the following reason: To respect privacy (The patient or proxy has requested that the information not be shared).  Follow Up Plan:   Earney [place] Upstream Scheduler

## 2019-10-28 DIAGNOSIS — F419 Anxiety disorder, unspecified: Secondary | ICD-10-CM | POA: Diagnosis not present

## 2019-10-28 DIAGNOSIS — Z85038 Personal history of other malignant neoplasm of large intestine: Secondary | ICD-10-CM | POA: Diagnosis not present

## 2019-10-28 DIAGNOSIS — S0003XA Contusion of scalp, initial encounter: Secondary | ICD-10-CM | POA: Diagnosis not present

## 2019-10-28 DIAGNOSIS — E1142 Type 2 diabetes mellitus with diabetic polyneuropathy: Secondary | ICD-10-CM | POA: Diagnosis not present

## 2019-10-28 DIAGNOSIS — I4811 Longstanding persistent atrial fibrillation: Secondary | ICD-10-CM | POA: Diagnosis not present

## 2019-10-28 DIAGNOSIS — S4991XA Unspecified injury of right shoulder and upper arm, initial encounter: Secondary | ICD-10-CM | POA: Diagnosis not present

## 2019-10-28 DIAGNOSIS — S52201A Unspecified fracture of shaft of right ulna, initial encounter for closed fracture: Secondary | ICD-10-CM | POA: Diagnosis not present

## 2019-10-28 DIAGNOSIS — S52611A Displaced fracture of right ulna styloid process, initial encounter for closed fracture: Secondary | ICD-10-CM | POA: Diagnosis not present

## 2019-10-28 DIAGNOSIS — I4821 Permanent atrial fibrillation: Secondary | ICD-10-CM | POA: Diagnosis not present

## 2019-10-28 DIAGNOSIS — E785 Hyperlipidemia, unspecified: Secondary | ICD-10-CM | POA: Diagnosis not present

## 2019-10-28 DIAGNOSIS — I509 Heart failure, unspecified: Secondary | ICD-10-CM | POA: Diagnosis not present

## 2019-10-28 DIAGNOSIS — I130 Hypertensive heart and chronic kidney disease with heart failure and stage 1 through stage 4 chronic kidney disease, or unspecified chronic kidney disease: Secondary | ICD-10-CM | POA: Diagnosis not present

## 2019-10-28 DIAGNOSIS — N1831 Chronic kidney disease, stage 3a: Secondary | ICD-10-CM | POA: Diagnosis not present

## 2019-10-28 DIAGNOSIS — Z85028 Personal history of other malignant neoplasm of stomach: Secondary | ICD-10-CM | POA: Diagnosis not present

## 2019-10-28 DIAGNOSIS — K219 Gastro-esophageal reflux disease without esophagitis: Secondary | ICD-10-CM | POA: Diagnosis not present

## 2019-10-28 DIAGNOSIS — Z79899 Other long term (current) drug therapy: Secondary | ICD-10-CM | POA: Diagnosis not present

## 2019-10-28 DIAGNOSIS — E039 Hypothyroidism, unspecified: Secondary | ICD-10-CM | POA: Diagnosis not present

## 2019-10-28 DIAGNOSIS — S52301A Unspecified fracture of shaft of right radius, initial encounter for closed fracture: Secondary | ICD-10-CM | POA: Diagnosis not present

## 2019-10-28 DIAGNOSIS — F329 Major depressive disorder, single episode, unspecified: Secondary | ICD-10-CM | POA: Diagnosis not present

## 2019-10-28 DIAGNOSIS — S0093XA Contusion of unspecified part of head, initial encounter: Secondary | ICD-10-CM | POA: Diagnosis not present

## 2019-10-28 DIAGNOSIS — S065X0A Traumatic subdural hemorrhage without loss of consciousness, initial encounter: Secondary | ICD-10-CM | POA: Diagnosis not present

## 2019-10-28 DIAGNOSIS — I129 Hypertensive chronic kidney disease with stage 1 through stage 4 chronic kidney disease, or unspecified chronic kidney disease: Secondary | ICD-10-CM | POA: Diagnosis not present

## 2019-10-28 DIAGNOSIS — S6991XA Unspecified injury of right wrist, hand and finger(s), initial encounter: Secondary | ICD-10-CM | POA: Diagnosis not present

## 2019-10-28 DIAGNOSIS — S065X1A Traumatic subdural hemorrhage with loss of consciousness of 30 minutes or less, initial encounter: Secondary | ICD-10-CM | POA: Diagnosis not present

## 2019-10-28 DIAGNOSIS — E1122 Type 2 diabetes mellitus with diabetic chronic kidney disease: Secondary | ICD-10-CM | POA: Diagnosis not present

## 2019-10-28 DIAGNOSIS — S52501A Unspecified fracture of the lower end of right radius, initial encounter for closed fracture: Secondary | ICD-10-CM | POA: Diagnosis not present

## 2019-10-28 DIAGNOSIS — Z7901 Long term (current) use of anticoagulants: Secondary | ICD-10-CM | POA: Diagnosis not present

## 2019-10-28 DIAGNOSIS — I2510 Atherosclerotic heart disease of native coronary artery without angina pectoris: Secondary | ICD-10-CM | POA: Diagnosis not present

## 2019-10-28 DIAGNOSIS — S065X9A Traumatic subdural hemorrhage with loss of consciousness of unspecified duration, initial encounter: Secondary | ICD-10-CM | POA: Diagnosis not present

## 2019-11-03 ENCOUNTER — Other Ambulatory Visit: Payer: Self-pay | Admitting: Family Medicine

## 2019-11-03 ENCOUNTER — Telehealth: Payer: Self-pay | Admitting: Family Medicine

## 2019-11-03 DIAGNOSIS — S0990XD Unspecified injury of head, subsequent encounter: Secondary | ICD-10-CM

## 2019-11-03 NOTE — Telephone Encounter (Signed)
Left message on machine to call back  

## 2019-11-03 NOTE — Telephone Encounter (Signed)
Appointment made for virtual for tomorrow

## 2019-11-03 NOTE — Telephone Encounter (Signed)
Daughter: Erin Steele Call back # [phone]  Erin Steele states [facility] next available appointment is in Sept. Daughter would like a phone call back. She has few question.

## 2019-11-03 NOTE — Telephone Encounter (Signed)
Patient fell [place]. Patient hospitalized with broken arm and brian bleed in [place]. Patient has been released from hospital and needs a referral to [facility], patient prefers to see : Andrey Spearman, MD. Patient has already been referred to ortho by hospital.Patient also has been took off diabetic medication in hospital, per hospitalist patient's glucose levels are normal. Patient also taken off Eliquis.

## 2019-11-03 NOTE — Telephone Encounter (Signed)
Referral placed. See if she wants to do a VV with [place] but if she is feeling beter she does not have.

## 2019-11-03 NOTE — Telephone Encounter (Signed)
Left detailed message on machine that referral has been placed and to let us know if she would like virtual appointment.

## 2019-11-04 ENCOUNTER — Other Ambulatory Visit: Payer: Self-pay

## 2019-11-04 ENCOUNTER — Telehealth (INDEPENDENT_AMBULATORY_CARE_PROVIDER_SITE_OTHER): Payer: PPO | Admitting: Family Medicine

## 2019-11-04 ENCOUNTER — Encounter: Payer: Self-pay | Admitting: Family Medicine

## 2019-11-04 VITALS — BP 112/84 | Temp 98.0°F | Wt 190.0 lb

## 2019-11-04 DIAGNOSIS — I10 Essential (primary) hypertension: Secondary | ICD-10-CM | POA: Diagnosis not present

## 2019-11-04 DIAGNOSIS — S065XAA Traumatic subdural hemorrhage with loss of consciousness status unknown, initial encounter: Secondary | ICD-10-CM | POA: Insufficient documentation

## 2019-11-04 DIAGNOSIS — S065X9A Traumatic subdural hemorrhage with loss of consciousness of unspecified duration, initial encounter: Secondary | ICD-10-CM | POA: Diagnosis not present

## 2019-11-04 DIAGNOSIS — E1121 Type 2 diabetes mellitus with diabetic nephropathy: Secondary | ICD-10-CM | POA: Diagnosis not present

## 2019-11-04 DIAGNOSIS — M25531 Pain in right wrist: Secondary | ICD-10-CM | POA: Diagnosis not present

## 2019-11-04 DIAGNOSIS — S52501A Unspecified fracture of the lower end of right radius, initial encounter for closed fracture: Secondary | ICD-10-CM | POA: Diagnosis not present

## 2019-11-04 NOTE — Assessment & Plan Note (Addendum)
Patient fell down 8 stairs while she [place] the beach and suffered significant trauma. She blacked out and injured her wrist. She sought care the next day at [facility] where it was confirmed that she had a fracture in he right wrist and a subdural hematoma. She asked to come home for further care and once the second CT of the head showed no change in her hematoma and she was clinically improving they allowed her to return home. She was placed on Keppra to prevent seizure activity although she has not had any seizure activity. It will expire tomorrow and she is experiencing significant somnolence so we will let this expire and monitor. She lives in a facility so they have been monitoring her along with her family. Her CT scan has been ordered and she has been referred to neurosurgery for evaluation. Her daughter reports she continues to improve and has developed any new symptoms such as nausea, vomiting, visual changes or neurologic concerns. They will seek care if she worsens. Spent 20 minutes with patient discussing case and plan of care.

## 2019-11-04 NOTE — Progress Notes (Signed)
Virtual Visit via Video Note  I connected with Erin Steele on 11/04/19 at  9:40 AM EDT by a video enabled telemedicine application and verified that I am speaking with the correct person using two identifiers.  Location: Patient: home, patient, her daughter and provider were all in visit Provider: home   I discussed the limitations of evaluation and management by telemedicine and the availability of in person appointments. The patient expressed understanding and agreed to proceed. Wynonia Musty, CMA was able to get the patient set up on a video visit   Subjective:    Patient ID: Erin Steele, female    DOB: 01-Jun-1927, 86 y.o.   MRN: [id number]  Chief Complaint  Patient presents with  . Discuss Medication    recent fall, brain bleed, unable to take eliquis right now    HPI Patient is in today for evaluation after a recent hospitalization secondary to a fall and development of a subdural hematoma. Patient fell down 8 stairs while she was on vacation at the beach and suffered significant trauma. She blacked out and injured her wrist. She sought care the next day at [facility] where it was confirmed that she had a fracture in he right wrist and a subdural hematoma. She asked to come home for further care and once the second CT of the head showed no change in her hematoma and she was clinically improving they allowed her to return home. She was placed on Keppra to prevent seizure activity although she has not had any seizure activity. It will expire tomorrow and she is experiencing significant somnolence. Denies CP/palp/SOB/congestion/fevers/GI or GU c/o. Taking meds as prescribed. She does endorse a mild headache today but it is actually improving presently. No confusion or other changes  Past Medical History:  Diagnosis Date  . A-fib ([place])   . Atrial fibrillation ([place])   . Bilateral rotator cuff dysfunction   . CAD (coronary artery disease)   . CHF (congestive heart  failure) ([place])   . Diabetes mellitus with diabetic nephropathy ([place])   . Diabetes mellitus without complication ([place])   . High cholesterol   . HIV infection ([place])   . Hypercalcemia 03/25/2018  . Hyperlipemia   . Hypertension   . Hypokalemia   . Osteoarthritis   . Vitamin D deficiency     Past Surgical History:  Procedure Laterality Date  . ABDOMINAL HYSTERECTOMY    . ANKLE SURGERY     and arms  . APPENDECTOMY    . chicken pox    . intestinal surgery      for cancer  . KIDNEY STONE SURGERY    . measles      Family History  Problem Relation Age of Onset  . COPD Mother        emphysema, nonsmoker  . Heart disease Sister   . Cancer Sister        lung CA in smoker  . Heart disease Daughter   . Heart disease Maternal Grandmother   . Heart disease Maternal Grandfather   . Heart disease Daughter     Social History   Socioeconomic History  . Marital status: Married    Spouse name: Not on file  . Number of children: Not on file  . Years of education: Not on file  . Highest education level: Not on file  Occupational History  . Occupation: retired  Tobacco Use  . Smoking status: Never Smoker  . Smokeless tobacco: Never Used  Substance and Sexual  Activity  . Alcohol use: Yes    Comment: occasionally, mexican milkshake at Christmas  . Drug use: No  . Sexual activity: Not on file  Other Topics Concern  . Not on file  Social History Narrative       was a homemaker with many side jobs   Raised 4 children has relocated here from [facility] to be near family   Lives a [place] with husband   No dietary restrictions   Social Determinants of Radio broadcast assistant Strain:   . Difficulty of Paying Living Expenses:   Food Insecurity:   . Worried About Charity fundraiser in the Last Year:   . Arboriculturist in the Last Year:   Transportation Needs:   . Film/video editor (Medical):   Marland Kitchen Lack of Transportation (Non-Medical):   Physical Activity:   . Days of  Exercise per Week:   . Minutes of Exercise per Session:   Stress:   . Feeling of Stress :   Social Connections:   . Frequency of Communication with Friends and Family:   . Frequency of Social Gatherings with Friends and Family:   . Attends Religious Services:   . Active Member of Clubs or Organizations:   . Attends Archivist Meetings:   Marland Kitchen Marital Status:   Intimate Partner Violence:   . Fear of Current or Ex-Partner:   . Emotionally Abused:   Marland Kitchen Physically Abused:   . Sexually Abused:     Outpatient Medications Prior to Visit  Medication Sig Dispense Refill  . acetaminophen (TYLENOL) 500 MG tablet Take 500 mg by mouth 3 (three) times daily as needed for mild pain, moderate pain, fever or headache.    Marland Kitchen apixaban (ELIQUIS) 5 MG TABS tablet Take 5 mg by mouth 2 (two) times daily.    Marland Kitchen apixaban (ELIQUIS) 5 MG TABS tablet Take 1 tablet (5 mg total) by mouth 2 (two) times daily. 10 tablet 0  . BIOFREEZE 4 % GEL Apply topically as needed (shoulders).    . Cholecalciferol 3000 units TABS Take by mouth 2 (two) times daily.    . famotidine (PEPCID) 20 MG tablet Take 1 tablet (20 mg total) by mouth at bedtime. 30 tablet 5  . FLUoxetine (PROZAC) 20 MG tablet Take 20 mg by mouth daily.    . furosemide (LASIX) 20 MG tablet Take 20 mg by mouth daily.    Marland Kitchen levETIRAcetam (KEPPRA) 500 MG tablet Take by mouth.    . levothyroxine (SYNTHROID, LEVOTHROID) 25 MCG tablet Take 25 mcg by mouth daily before breakfast.    . loperamide (IMODIUM) 2 MG capsule Take 2 mg by mouth as needed for diarrhea or loose stools.    Marland Kitchen losartan (COZAAR) 25 MG tablet Take 25 mg by mouth daily.    . metFORMIN (GLUCOPHAGE-XR) 500 MG 24 hr tablet Take 500 mg by mouth daily.    . metoprolol succinate (TOPROL-XL) 25 MG 24 hr tablet Take 25 mg by mouth daily.    . ondansetron (ZOFRAN) 4 MG tablet Take 4 mg by mouth every 8 (eight) hours as needed for nausea or vomiting.    . Polyvinyl Alcohol-Povidone (REFRESH OP) Apply 1  drop to eye 4 (four) times daily.    . potassium chloride SA (K-DUR) 20 MEQ tablet Take one tablet by mouth daily 90 tablet 12  . pravastatin (PRAVACHOL) 20 MG tablet Take 20 mg by mouth daily.     No facility-administered medications  prior to visit.    Allergies  Allergen Reactions  . Morphine And Related Anxiety and Other (See Comments)    Pt is unsure of allergy-02/17/18 Pt is unsure of allergy-02/17/18 Pt is unsure of allergy-02/17/18   . Penicillins Other (See Comments)    Doesn't remember reaction Doesn't remember reaction     Review of Systems  Constitutional: Negative for fever and malaise/fatigue.  HENT: Negative for congestion, ear discharge, hearing loss and sinus pain.   Eyes: Negative for blurred vision, double vision and photophobia.  Respiratory: Negative for shortness of breath.   Cardiovascular: Negative for chest pain, palpitations and leg swelling.  Gastrointestinal: Negative for abdominal pain, blood in stool and nausea.  Genitourinary: Negative for dysuria and frequency.  Musculoskeletal: Positive for falls.  Skin: Negative for rash.  Neurological: Positive for headaches. Negative for dizziness and loss of consciousness.  Endo/Heme/Allergies: Negative for environmental allergies.  Psychiatric/Behavioral: Negative for depression. The patient is not nervous/anxious.        Objective:    Physical Exam Constitutional:      Appearance: She is not ill-appearing.  HENT:     Head:     Comments: echymosis noted over face bilaterally    Right Ear: External ear normal.     Left Ear: External ear normal.  Pulmonary:     Effort: Pulmonary effort is normal.  Neurological:     Mental Status: She is alert and oriented to person, place, and time.  Psychiatric:        Behavior: Behavior normal.     BP 112/84 Comment: pt reported  Temp 98 F (36.7 C) (Oral)   Wt 190 lb (86.2 kg)   BMI 32.61 kg/m  Wt Readings from Last 3 Encounters:  11/04/19 190 lb  (86.2 kg)  06/23/19 158 lb 3.2 oz (71.8 kg)  11/05/18 160 lb (72.6 kg)    Diabetic Foot Exam - Simple   No data filed     Lab Results  Component Value Date   WBC 7.8 06/23/2019   HGB 12.6 06/23/2019   HCT 37.6 06/23/2019   PLT 235.0 06/23/2019   GLUCOSE 101 (H) 06/23/2019   CHOL 96 06/23/2019   TRIG 72.0 06/23/2019   HDL 42.60 06/23/2019   LDLCALC 39 06/23/2019   ALT 9 06/23/2019   AST 16 06/23/2019   NA 139 06/23/2019   K 4.0 06/23/2019   CL 107 06/23/2019   CREATININE 0.96 06/23/2019   BUN 21 06/23/2019   CO2 25 06/23/2019   TSH 2.85 06/23/2019   HGBA1C 6.0 06/23/2019    Lab Results  Component Value Date   TSH 2.85 06/23/2019   Lab Results  Component Value Date   WBC 7.8 06/23/2019   HGB 12.6 06/23/2019   HCT 37.6 06/23/2019   MCV 94.9 06/23/2019   PLT 235.0 06/23/2019   Lab Results  Component Value Date   NA 139 06/23/2019   K 4.0 06/23/2019   CO2 25 06/23/2019   GLUCOSE 101 (H) 06/23/2019   BUN 21 06/23/2019   CREATININE 0.96 06/23/2019   BILITOT 1.0 06/23/2019   ALKPHOS 55 06/23/2019   AST 16 06/23/2019   ALT 9 06/23/2019   PROT 6.7 06/23/2019   ALBUMIN 4.2 06/23/2019   CALCIUM 9.8 06/23/2019   GFR 54.63 (L) 06/23/2019   Lab Results  Component Value Date   CHOL 96 06/23/2019   Lab Results  Component Value Date   HDL 42.60 06/23/2019   Lab Results  Component Value Date  [place] 39 06/23/2019   Lab Results  Component Value Date   TRIG 72.0 06/23/2019   Lab Results  Component Value Date   CHOLHDL 2 06/23/2019   Lab Results  Component Value Date   HGBA1C 6.0 06/23/2019       Assessment & Plan:   Problem List Items Addressed This Visit    Hypertension    Well controlled, no changes to meds. Encouraged heart healthy diet such as the DASH diet and exercise as tolerated.       Diabetes mellitus with diabetic nephropathy (HCC)    hgba1c acceptable, minimize simple carbs. Increase exercise as tolerated. Continue current meds       Subdural hematoma ([place]) - Primary    Patient fell down 8 stairs while she was [place] vacation at the beach and suffered significant trauma. She blacked out and injured her wrist. She sought care the next day at [facility] where it was confirmed that she had a fracture in he right wrist and a subdural hematoma. She asked to come home for further care and once the second CT of the head showed no change in her hematoma and she was clinically improving they allowed her to return home. She was placed on Keppra to prevent seizure activity although she has not had any seizure activity. It will expire tomorrow and she is experiencing significant somnolence so we will let this expire and monitor. She lives in a facility so they have been monitoring her along with her family. Her CT scan has been ordered and she has been referred to neurosurgery for evaluation. Her daughter reports she continues to improve and has developed any new symptoms such as nausea, vomiting, visual changes or neurologic concerns. They will seek care if she worsens. Spent 20 minutes with patient discussing case and plan of care.       Relevant Orders   Ambulatory referral to Neurosurgery   CT Head Wo Contrast      I am having Erin Steele maintain her potassium chloride SA, pravastatin, ondansetron, loperamide, Polyvinyl Alcohol-Povidone (REFRESH OP), Cholecalciferol, Biofreeze, metoprolol succinate, furosemide, apixaban, acetaminophen, levothyroxine, famotidine, apixaban, losartan, metFORMIN, FLUoxetine, and levETIRAcetam.  No orders of the defined types were placed in this encounter.    I discussed the assessment and treatment plan with the patient. The patient was provided an opportunity to ask questions and all were answered. The patient agreed with the plan and demonstrated an understanding of the instructions.   The patient was advised to call back or seek an in-person evaluation if the symptoms worsen or if the  condition fails to improve as anticipated.  I provided 20 minutes of non-face-to-face time during this encounter.   Penni Homans, MD

## 2019-11-04 NOTE — Assessment & Plan Note (Signed)
hgba1c acceptable, minimize simple carbs. Increase exercise as tolerated. Continue current meds 

## 2019-11-04 NOTE — Assessment & Plan Note (Signed)
Well controlled, no changes to meds. Encouraged heart healthy diet such as the DASH diet and exercise as tolerated.  °

## 2019-11-06 ENCOUNTER — Other Ambulatory Visit: Payer: Self-pay

## 2019-11-06 ENCOUNTER — Ambulatory Visit (HOSPITAL_BASED_OUTPATIENT_CLINIC_OR_DEPARTMENT_OTHER)
Admission: RE | Admit: 2019-11-06 | Discharge: 2019-11-06 | Disposition: A | Discharge status: Home / Self Care | Payer: PPO | Source: Ambulatory Visit | Attending: Family Medicine | Admitting: Family Medicine

## 2019-11-06 DIAGNOSIS — I6782 Cerebral ischemia: Secondary | ICD-10-CM | POA: Diagnosis not present

## 2019-11-06 DIAGNOSIS — S065X9A Traumatic subdural hemorrhage with loss of consciousness of unspecified duration, initial encounter: Secondary | ICD-10-CM | POA: Diagnosis not present

## 2019-11-06 DIAGNOSIS — G9389 Other specified disorders of brain: Secondary | ICD-10-CM | POA: Diagnosis not present

## 2019-11-06 DIAGNOSIS — S065XAA Traumatic subdural hemorrhage with loss of consciousness status unknown, initial encounter: Secondary | ICD-10-CM

## 2019-11-06 DIAGNOSIS — I7090 Unspecified atherosclerosis: Secondary | ICD-10-CM | POA: Diagnosis not present

## 2019-11-06 DIAGNOSIS — S065X0A Traumatic subdural hemorrhage without loss of consciousness, initial encounter: Secondary | ICD-10-CM | POA: Diagnosis not present

## 2019-11-09 ENCOUNTER — Telehealth: Payer: Self-pay | Admitting: *Deleted

## 2019-11-09 NOTE — Telephone Encounter (Signed)
Spoke with daughter about results and she asked if she needs to still keep virtual appointment on Wednesday 11/11/19 to discuss about Eliquis?

## 2019-11-09 NOTE — Telephone Encounter (Signed)
-----   Message from Mosie Lukes, MD sent at 11/06/2019  4:47 PM EDT ----- Notify no evidence of persistent subdural hematoma.

## 2019-11-09 NOTE — Telephone Encounter (Signed)
If she has no new symptoms and her old symptoms she can cancel the appointment. When is she seeing neurology?

## 2019-11-09 NOTE — Telephone Encounter (Signed)
If no new symptoms. They can restart the Eliquis at the end of the week.

## 2019-11-09 NOTE — Telephone Encounter (Signed)
Daughter stated that she is having no symptoms.  She is still waiting on appointment for Neurosurgeon.  She stated that you wanted them to see neurosurgeon, not neurology.

## 2019-11-10 ENCOUNTER — Encounter: Payer: Self-pay | Admitting: *Deleted

## 2019-11-10 NOTE — Telephone Encounter (Signed)
She has a referral for both and yes I put in the neurosurgery consult due to the recent bleed. Please check on status of  neurosurgery consult.

## 2019-11-10 NOTE — Telephone Encounter (Signed)
LM for [facility] referral coordinator  Requesting call back to me or Sherri to notify of urgent appt being scheduled with appt info/provider/etc

## 2019-11-11 ENCOUNTER — Telehealth: Payer: PPO | Admitting: Family Medicine

## 2019-11-11 NOTE — Telephone Encounter (Signed)
Daughter Willette Cluster (work number [phone]) notified that pt needs to see neurosurgeon and neurology.  She will call neurology back and neurosurgeon office called today to schedule and she will call them both back today to schedule.

## 2019-11-12 ENCOUNTER — Telehealth: Payer: Self-pay | Admitting: Family Medicine

## 2019-11-12 NOTE — Telephone Encounter (Signed)
Caller: [employer]) Call back # [phone]  Would like direction on Eliquis

## 2019-11-16 DIAGNOSIS — S065X9A Traumatic subdural hemorrhage with loss of consciousness of unspecified duration, initial encounter: Secondary | ICD-10-CM | POA: Diagnosis not present

## 2019-11-18 NOTE — Telephone Encounter (Signed)
Notified Nurse [place] at [facility] patient is to take 5 mg BID .

## 2019-11-27 DIAGNOSIS — S52501A Unspecified fracture of the lower end of right radius, initial encounter for closed fracture: Secondary | ICD-10-CM | POA: Diagnosis not present

## 2019-12-21 NOTE — Progress Notes (Signed)
CARDIOLOGY OFFICE NOTE  Date:  12/28/2019    Erin Steele Date of Birth: 04-13-1927 Medical Record #[id number]  PCP:  Mosie Lukes, MD  Cardiologist:  [facility]   Chief Complaint  Patient presents with  . Follow-up    Seen for Dr. Marlou Porch    History of Present Illness: Erin Steele is a 86 y.o. female who presents today for a 6 month check. Seen for Dr. Marlou Porch.   She has a history of persistent AF, chronic anticoagulation with Eliquis, moderate MR, non occlusive CAD, chronic systolic HF, OSA, HTN, DM, carcinoid tumor of the small intestine and hypothyroidism. Has severe TR with moderate pulmonary HTN as well. Remote cath in 2017 with EF of 35 to 40%. Has not tolerated Entresto.   Resides at [place].   I last saw Erin in July of 2020 - husband on Hospice and passed in January of 2021. Lots of stress. Seen by Dr. Marlou Porch in March. Some occasional chest discomfort that is chronic was noted.   Comes in today. Here with Erin Steele.  She is taking care of Erin room mate now since Erin husband has passed. She fell back in 30-Oct-2022 - at the beach down 8 stairs - had broken arm and a brain bleed. Has seen neurosurgery here following CT scan - has been released - was off Eliquis about 3 weeks and now back on. Erin cast was removed this past Friday. Still with some headaches. But otherwise, back to Erin normal self. She is trying to be careful. Erin chest pain is at baseline. She is not dizzy. No recent labs. BP is ok.   Past Medical History:  Diagnosis Date  . A-fib ([place])   . Atrial fibrillation ([place])   . Bilateral rotator cuff dysfunction   . CAD (coronary artery disease)   . CHF (congestive heart failure) ([place])   . Diabetes mellitus with diabetic nephropathy ([place])   . Diabetes mellitus without complication ([place])   . High cholesterol   . HIV infection ([place])   . Hypercalcemia 03/25/2018  . Hyperlipemia   . Hypertension   . Hypokalemia   . Osteoarthritis   . Vitamin D  deficiency     Past Surgical History:  Procedure Laterality Date  . ABDOMINAL HYSTERECTOMY    . ANKLE SURGERY     and arms  . APPENDECTOMY    . chicken pox    . intestinal surgery      for cancer  . KIDNEY STONE SURGERY    . measles       Medications: Current Meds  Medication Sig  . acetaminophen (TYLENOL) 500 MG tablet Take 500 mg by mouth 3 (three) times daily as needed for mild pain, moderate pain, fever or headache.  Marland Kitchen apixaban (ELIQUIS) 5 MG TABS tablet Take 1 tablet (5 mg total) by mouth 2 (two) times daily.  Marland Kitchen BIOFREEZE 4 % GEL Apply topically as needed (shoulders).  . Cholecalciferol 3000 units TABS Take by mouth 2 (two) times daily.  . famotidine (PEPCID) 20 MG tablet Take 1 tablet (20 mg total) by mouth at bedtime.  Marland Kitchen FLUoxetine (PROZAC) 20 MG tablet Take 20 mg by mouth daily.  . furosemide (LASIX) 20 MG tablet Take 20 mg by mouth daily.  Marland Kitchen levothyroxine (SYNTHROID, LEVOTHROID) 25 MCG tablet Take 25 mcg by mouth daily before breakfast.  . loperamide (IMODIUM) 2 MG capsule Take 2 mg by mouth as needed for diarrhea or loose stools.  Marland Kitchen losartan (COZAAR)  25 MG tablet Take 25 mg by mouth daily.  . metFORMIN (GLUCOPHAGE-XR) 500 MG 24 hr tablet Take 500 mg by mouth daily.  . metoprolol succinate (TOPROL-XL) 25 MG 24 hr tablet Take 25 mg by mouth daily.  . ondansetron (ZOFRAN) 4 MG tablet Take 4 mg by mouth every 8 (eight) hours as needed for nausea or vomiting.  . Polyvinyl Alcohol-Povidone (REFRESH OP) Apply 1 drop to eye 4 (four) times daily.  . potassium chloride SA (K-DUR) 20 MEQ tablet Take one tablet by mouth daily  . pravastatin (PRAVACHOL) 20 MG tablet Take 20 mg by mouth daily.     Allergies: Allergies  Allergen Reactions  . Morphine And Related Anxiety and Other (See Comments)    Pt is unsure of allergy-02/17/18 Pt is unsure of allergy-02/17/18 Pt is unsure of allergy-02/17/18   . Penicillins Other (See Comments)    Doesn't remember reaction Doesn't  remember reaction     Social History: The patient  reports that she has never smoked. She has never used smokeless tobacco. She reports current alcohol use. She reports that she does not use drugs.   Family History: The patient's family history includes COPD in Erin mother; Cancer in Erin sister; Heart disease in Erin Steele, Steele, maternal grandfather, maternal grandmother, and sister.   Review of Systems: Please see the history of present illness.   All other systems are reviewed and negative.   Physical Exam: VS:  BP 130/90   Pulse 74   Ht 5\' 4"  (1.626 m)   Wt 155 lb 3.2 oz (70.4 kg)   SpO2 98%   BMI 26.64 kg/m  .  BMI Body mass index is 26.64 kg/m.  Wt Readings from Last 3 Encounters:  12/28/19 155 lb 3.2 oz (70.4 kg)  11/04/19 190 lb (86.2 kg)  06/23/19 158 lb 3.2 oz (71.8 kg)    General: Pleasant. Elderly. Alert and in no acute distress.   Cardiac: Irregular irregular rhythm. Rate is fine. Trace pedal edema.  Respiratory:  Lungs are clear to auscultation bilaterally with normal work of breathing.  GI: Soft and nontender.  MS: No deformity or atrophy. Gait and ROM intact.  Skin: Warm and dry. Color is normal.  Neuro:  Strength and sensation are intact and no gross focal deficits noted.  Psych: Alert, appropriate and with normal affect.   LABORATORY DATA:  EKG:  EKG is ordered today.  Personally reviewed by me. This demonstrates AF with controlled VR of 74  Lab Results  Component Value Date   WBC 7.8 06/23/2019   HGB 12.6 06/23/2019   HCT 37.6 06/23/2019   PLT 235.0 06/23/2019   GLUCOSE 101 (H) 06/23/2019   CHOL 96 06/23/2019   TRIG 72.0 06/23/2019   HDL 42.60 06/23/2019   LDLCALC 39 06/23/2019   ALT 9 06/23/2019   AST 16 06/23/2019   NA 139 06/23/2019   K 4.0 06/23/2019   CL 107 06/23/2019   CREATININE 0.96 06/23/2019   BUN 21 06/23/2019   CO2 25 06/23/2019   TSH 2.85 06/23/2019   HGBA1C 6.0 06/23/2019     BNP (last 3 results) No results for  input(s): BNP in the last 8760 hours.  ProBNP (last 3 results) No results for input(s): PROBNP in the last 8760 hours.   Other Studies Reviewed Today:    ASSESSMENT & PLAN:    1. Persistent AF - has CHADSVASC of at least 6 - Erin rate is controlled. She is back on anticoagulation.  2. Chronic anticoagulation - was off for 3 weeks earlier this summer due to SDH - released from neurosurgery and is back on therapy.   3. Chronic systolic HF - has not tolerated Entresto in the past - last EF 50 to 55% - has not tolerated Entresto - on beta blocker and ARB - Erin symptoms are stable.   4. Valvular heart disease - monitoring clinically - would favor conservative approach - seems to be holding Erin own.   5. Pulmonary HTN - see above - favor conservative management.   6. Advanced age - recent fall from earlier this summer noted.   Current medicines are reviewed with the patient today.  The patient does not have concerns regarding medicines other than what has been noted above.  The following changes have been made:  See above.  Labs/ tests ordered today include:    Orders Placed This Encounter  Procedures  . Basic metabolic panel  . CBC  . EKG 12-Lead     Disposition:   FU with Dr. Marlou Porch - she wishes to come back in a year - sooner if problems arise - Steele will be in touch as needed.   Patient is agreeable to this plan and will call if any problems develop in the interim.   SignedTruitt Merle, NP  12/28/2019 9:06 AM  [facility] [address] [place], [place]  [address] Phone: [phone] Fax: [phone]

## 2019-12-25 DIAGNOSIS — S52501A Unspecified fracture of the lower end of right radius, initial encounter for closed fracture: Secondary | ICD-10-CM | POA: Diagnosis not present

## 2019-12-28 ENCOUNTER — Ambulatory Visit (INDEPENDENT_AMBULATORY_CARE_PROVIDER_SITE_OTHER): Payer: PPO | Admitting: Nurse Practitioner

## 2019-12-28 ENCOUNTER — Encounter: Payer: Self-pay | Admitting: Nurse Practitioner

## 2019-12-28 ENCOUNTER — Other Ambulatory Visit: Payer: Self-pay

## 2019-12-28 VITALS — BP 130/90 | HR 74 | Ht 64.0 in | Wt 155.2 lb

## 2019-12-28 DIAGNOSIS — I5022 Chronic systolic (congestive) heart failure: Secondary | ICD-10-CM | POA: Diagnosis not present

## 2019-12-28 DIAGNOSIS — I10 Essential (primary) hypertension: Secondary | ICD-10-CM

## 2019-12-28 DIAGNOSIS — I2510 Atherosclerotic heart disease of native coronary artery without angina pectoris: Secondary | ICD-10-CM

## 2019-12-28 DIAGNOSIS — Z7901 Long term (current) use of anticoagulants: Secondary | ICD-10-CM | POA: Diagnosis not present

## 2019-12-28 DIAGNOSIS — I4821 Permanent atrial fibrillation: Secondary | ICD-10-CM

## 2019-12-28 LAB — BASIC METABOLIC PANEL
BUN/Creatinine Ratio: 23 (ref 12–28)
BUN: 26 mg/dL (ref 10–36)
CO2: 20 mmol/L (ref 20–29)
Calcium: 10.1 mg/dL (ref 8.7–10.3)
Chloride: 105 mmol/L (ref 96–106)
Creatinine, Ser: 1.13 mg/dL — ABNORMAL HIGH (ref 0.57–1.00)
GFR calc Af Amer: 49 mL/min/{1.73_m2} — ABNORMAL LOW (ref >59)
GFR calc non Af Amer: 43 mL/min/{1.73_m2} — ABNORMAL LOW (ref >59)
Glucose: 101 mg/dL — ABNORMAL HIGH (ref 65–99)
Potassium: 4.3 mmol/L (ref 3.5–5.2)
Sodium: 139 mmol/L (ref 134–144)

## 2019-12-28 LAB — CBC
Hematocrit: 38.8 % (ref 34.0–46.6)
Hemoglobin: 12.3 g/dL (ref 11.1–15.9)
MCH: 31.3 pg (ref 26.6–33.0)
MCHC: 31.7 g/dL (ref 31.5–35.7)
MCV: 99 fL — ABNORMAL HIGH (ref 79–97)
Platelets: 230 10*3/uL (ref 150–450)
RBC: 3.93 x10E6/uL (ref 3.77–5.28)
RDW: 12.4 % (ref 11.7–15.4)
WBC: 7.8 10*3/uL (ref 3.4–10.8)

## 2019-12-28 NOTE — Patient Instructions (Addendum)
After Visit Summary:  We will be checking the following labs today - BMET and CBC   Medication Instructions:    Continue with your current medicines.    If you need a refill on your cardiac medications before your next appointment, please call your pharmacy.     Testing/Procedures To Be Arranged:  N/A  Follow-Up:   See Dr. Marlou Porch in one year.  You will receive a reminder letter in the mail two months in advance. If you don't receive a letter, please call our office to schedule the follow-up appointment.    At [facility], you and your health needs are our priority.  As part of our continuing mission to provide you with exceptional heart care, we have created designated Provider Care Teams.  These Care Teams include your primary Cardiologist (physician) and Advanced Practice Providers (APPs -  Physician Assistants and Nurse Practitioners) who all work together to provide you with the care you need, when you need it.  Special Instructions:  . Stay safe, wash your hands for at least 20 seconds and wear a mask when needed.  . It was good to talk with you today.    Call the [place] office at [phone] if you have any questions, problems or concerns.

## 2019-12-29 ENCOUNTER — Encounter: Payer: Self-pay | Admitting: *Deleted

## 2020-01-18 ENCOUNTER — Ambulatory Visit: Payer: PPO | Admitting: Diagnostic Neuroimaging

## 2020-02-23 NOTE — Telephone Encounter (Signed)
Error

## 2020-04-05 ENCOUNTER — Encounter: Payer: Self-pay | Admitting: Family Medicine

## 2020-04-05 NOTE — Telephone Encounter (Signed)
She is scheduled 04/12/20 at 3:20 pm

## 2020-04-12 ENCOUNTER — Ambulatory Visit (INDEPENDENT_AMBULATORY_CARE_PROVIDER_SITE_OTHER): Payer: PPO | Admitting: Family Medicine

## 2020-04-12 ENCOUNTER — Other Ambulatory Visit: Payer: Self-pay

## 2020-04-12 VITALS — BP 110/68 | HR 77 | Temp 98.0°F | Resp 18 | Wt 160.2 lb

## 2020-04-12 DIAGNOSIS — F32A Depression, unspecified: Secondary | ICD-10-CM

## 2020-04-12 DIAGNOSIS — R3915 Urgency of urination: Secondary | ICD-10-CM | POA: Diagnosis not present

## 2020-04-12 DIAGNOSIS — E1121 Type 2 diabetes mellitus with diabetic nephropathy: Secondary | ICD-10-CM

## 2020-04-12 DIAGNOSIS — I10 Essential (primary) hypertension: Secondary | ICD-10-CM | POA: Diagnosis not present

## 2020-04-12 DIAGNOSIS — R197 Diarrhea, unspecified: Secondary | ICD-10-CM | POA: Diagnosis not present

## 2020-04-12 DIAGNOSIS — E7800 Pure hypercholesterolemia, unspecified: Secondary | ICD-10-CM

## 2020-04-12 DIAGNOSIS — E559 Vitamin D deficiency, unspecified: Secondary | ICD-10-CM

## 2020-04-12 MED ORDER — AZELASTINE HCL 0.1 % NA SOLN: 2.0000 | Freq: Two times a day (BID) | NASAL | 12 refills | Status: DC

## 2020-04-12 MED ORDER — CHOLESTYRAMINE 4 GM/DOSE PO POWD: 2.0000 g | Freq: Three times a day (TID) | ORAL | 5 refills | Status: DC

## 2020-04-12 NOTE — Patient Instructions (Signed)
Arthritis °Arthritis is a term that is commonly used to refer to joint pain or joint disease. There are more than 100 types of arthritis. °What are the causes? °The most common cause of this condition is wear and tear of a joint. Other causes include: °· Gout. °· Inflammation of a joint. °· An infection of a joint. °· Sprains and other injuries near the joint. °· A reaction to medicines or drugs, or an allergic reaction. °In some cases, the cause may not be known. °What are the signs or symptoms? °The main symptom of this condition is pain in the joint during movement. Other symptoms include: °· Redness, swelling, or stiffness at a joint. °· Warmth coming from the joint. °· Fever. °· Overall feeling of illness. °How is this diagnosed? °This condition may be diagnosed with a physical exam and tests, including: °· Blood tests. °· Urine tests. °· Imaging tests, such as X-rays, an MRI, or a CT scan. °Sometimes, fluid is removed from a joint for testing. °How is this treated? °This condition may be treated with: °· Treatment of the cause, if it is known. °· Rest. °· Raising (elevating) the joint. °· Applying cold or hot packs to the joint. °· Medicines to improve symptoms and reduce inflammation. °· Injections of a steroid such as cortisone into the joint to help reduce pain and inflammation. °Depending on the cause of your arthritis, you may need to make lifestyle changes to reduce stress on your joint. Changes may include: °· Exercising more. °· Losing weight. °Follow these instructions at home: °Medicines °· Take over-the-counter and prescription medicines only as told by your health care provider. °· Do not take aspirin to relieve pain if your health care provider thinks that gout may be causing your pain. °Activity °· Rest your joint if told by your health care provider. Rest is important when your disease is active and your joint feels painful, swollen, or stiff. °· Avoid activities that make the pain worse. It is  important to balance activity with rest. °· Exercise your joint regularly with range-of-motion exercises as told by your health care provider. Try doing low-impact exercise, such as: °? Swimming. °? Water aerobics. °? Biking. °? Walking. °Managing pain, stiffness, and swelling ° °  ° °· If directed, put ice on the joint. °? Put ice in a plastic bag. °? Place a towel between your skin and the bag. °? Leave the ice on for 20 minutes, 2-3 times per day. °· If your joint is swollen, raise (elevate) it above the level of your heart if directed by your health care provider. °· If your joint feels stiff in the morning, try taking a warm shower. °· If directed, apply heat to the affected area as often as told by your health care provider. Use the heat source that your health care provider recommends, such as a moist heat pack or a heating pad. If you have diabetes, do not apply heat without permission from your health care provider. To apply heat: °? Place a towel between your skin and the heat source. °? Leave the heat on for 20-30 minutes. °? Remove the heat if your skin turns bright red. This is especially important if you are unable to feel pain, heat, or cold. You may have a greater risk of getting burned. °General instructions °· Do not use any products that contain nicotine or tobacco, such as cigarettes, e-cigarettes, and chewing tobacco. If you need help quitting, ask your health care provider. °· Keep   all follow-up visits as told by your health care provider. This is important. °Contact a health care provider if: °· The pain gets worse. °· You have a fever. °Get help right away if: °· You develop severe joint pain, swelling, or redness. °· Many joints become painful and swollen. °· You develop severe back pain. °· You develop severe weakness in your leg. °· You cannot control your bladder or bowels. °Summary °· Arthritis is a term that is commonly used to refer to joint pain or joint disease. There are more than  100 types of arthritis. °· The most common cause of this condition is wear and tear of a joint. Other causes include gout, inflammation or infection of the joint, sprains, or allergies. °· Symptoms of this condition include redness, swelling, or stiffness of the joint. Other symptoms include warmth, fever, or feeling ill. °· This condition is treated with rest, elevation, medicines, and applying cold or hot packs. °· Follow your health care provider's instructions about medicines, activity, exercises, and other home care treatments. °This information is not intended to replace advice given to you by your health care provider. Make sure you discuss any questions you have with your health care provider. °Document Revised: 03/03/2018 Document Reviewed: 03/03/2018 °Elsevier Patient Education © 2020 Elsevier Inc. ° °

## 2020-04-12 NOTE — Progress Notes (Signed)
Subjective:    Patient ID: Erin Steele, female    DOB: Aug 26, 1927, 87 y.o.   MRN: [id number]  Chief Complaint  Patient presents with  . Follow-up    HPI Patient is in today for follow up on chronic medical concerns. No recent febrile illness or hospitalizations. She is brought in today by her daughter from her skilled nursing facility. She continues to have numerous loose bowel movements daily and it is limiting her activity. No fevers or chills, nausea or vomiting. She stopped metformin but did not improve. Her headaches are improving but persistent since she her head injury. She is noting some intermittent right ear pain as well. No discharge. Denies CP/palp/SOB/HA/congestion/fevers or GU c/o. Taking meds as prescribed  Past Medical History:  Diagnosis Date  . A-fib ([place])   . Atrial fibrillation ([place])   . Bilateral rotator cuff dysfunction   . CAD (coronary artery disease)   . CHF (congestive heart failure) ([place])   . Diabetes mellitus with diabetic nephropathy ([place])   . Diabetes mellitus without complication ([place])   . High cholesterol   . HIV infection ([place])   . Hypercalcemia 03/25/2018  . Hyperlipemia   . Hypertension   . Hypokalemia   . Osteoarthritis   . Vitamin D deficiency     Past Surgical History:  Procedure Laterality Date  . ABDOMINAL HYSTERECTOMY    . ANKLE SURGERY     and arms  . APPENDECTOMY    . chicken pox    . intestinal surgery      for cancer  . KIDNEY STONE SURGERY    . measles      Family History  Problem Relation Age of Onset  . COPD Mother        emphysema, nonsmoker  . Heart disease Sister   . Cancer Sister        lung CA in smoker  . Heart disease Daughter   . Heart disease Maternal Grandmother   . Heart disease Maternal Grandfather   . Heart disease Daughter     Social History   Socioeconomic History  . Marital status: Married    Spouse name: Not on file  . Number of children: Not on file  . Years of education: Not on  file  . Highest education level: Not on file  Occupational History  . Occupation: retired  Tobacco Use  . Smoking status: Never Smoker  . Smokeless tobacco: Never Used  Substance and Sexual Activity  . Alcohol use: Yes    Comment: occasionally, mexican milkshake at Christmas  . Drug use: No  . Sexual activity: Not on file  Other Topics Concern  . Not on file  Social History Narrative       was a homemaker with many side jobs   Raised 4 children has relocated here from [facility] to be near family   Lives a [place] with husband   No dietary restrictions   Social Determinants of Radio broadcast assistant Strain: Not on file  Food Insecurity: Not on file  Transportation Needs: Not on file  Physical Activity: Not on file  Stress: Not on file  Social Connections: Not on file  Intimate Partner Violence: Not on file    Outpatient Medications Prior to Visit  Medication Sig Dispense Refill  . acetaminophen (TYLENOL) 500 MG tablet Take 500 mg by mouth 3 (three) times daily as needed for mild pain, moderate pain, fever or headache.    Marland Kitchen apixaban (ELIQUIS)  5 MG TABS tablet Take 1 tablet (5 mg total) by mouth 2 (two) times daily. 10 tablet 0  . BIOFREEZE 4 % GEL Apply topically as needed (shoulders).    . Cholecalciferol 3000 units TABS Take by mouth 2 (two) times daily.    . famotidine (PEPCID) 20 MG tablet Take 1 tablet (20 mg total) by mouth at bedtime. 30 tablet 5  . FLUoxetine (PROZAC) 20 MG tablet Take 20 mg by mouth daily.    . furosemide (LASIX) 20 MG tablet Take 20 mg by mouth daily.    Marland Kitchen levothyroxine (SYNTHROID, LEVOTHROID) 25 MCG tablet Take 25 mcg by mouth daily before breakfast.    . loperamide (IMODIUM) 2 MG capsule Take 2 mg by mouth as needed for diarrhea or loose stools.    Marland Kitchen losartan (COZAAR) 25 MG tablet Take 25 mg by mouth daily.    . ondansetron (ZOFRAN) 4 MG tablet Take 4 mg by mouth every 8 (eight) hours as needed for nausea or vomiting.    . Polyvinyl  Alcohol-Povidone (REFRESH OP) Apply 1 drop to eye 4 (four) times daily.    . potassium chloride SA (K-DUR) 20 MEQ tablet Take one tablet by mouth daily 90 tablet 12  . pravastatin (PRAVACHOL) 20 MG tablet Take 20 mg by mouth daily.    Marland Kitchen levETIRAcetam (KEPPRA) 500 MG tablet Take by mouth.    . metoprolol succinate (TOPROL-XL) 25 MG 24 hr tablet Take 25 mg by mouth daily. (Patient not taking: Reported on 04/12/2020)    . metFORMIN (GLUCOPHAGE-XR) 500 MG 24 hr tablet Take 500 mg by mouth daily. (Patient not taking: Reported on 04/12/2020)     No facility-administered medications prior to visit.    Allergies  Allergen Reactions  . Morphine And Related Anxiety and Other (See Comments)    Pt is unsure of allergy-02/17/18 Pt is unsure of allergy-02/17/18 Pt is unsure of allergy-02/17/18   . Penicillins Other (See Comments)    Doesn't remember reaction Doesn't remember reaction     Review of Systems  Constitutional: Positive for malaise/fatigue. Negative for fever.  HENT: Positive for ear pain. Negative for congestion.   Eyes: Negative for blurred vision.  Respiratory: Negative for shortness of breath.   Cardiovascular: Negative for chest pain, palpitations and leg swelling.  Gastrointestinal: Positive for abdominal pain and diarrhea. Negative for blood in stool, melena and nausea.  Genitourinary: Negative for dysuria and frequency.  Musculoskeletal: Negative for falls.  Skin: Negative for rash.  Neurological: Positive for headaches. Negative for dizziness and loss of consciousness.  Endo/Heme/Allergies: Negative for environmental allergies.  Psychiatric/Behavioral: Positive for depression. The patient is not nervous/anxious.        Objective:    Physical Exam Vitals and nursing note reviewed.  Constitutional:      General: She is not in acute distress.    Appearance: She is well-developed and well-nourished.  HENT:     Head: Normocephalic and atraumatic.     Right Ear: Tympanic  membrane, ear canal and external ear normal.     Left Ear: Tympanic membrane, ear canal and external ear normal.     Nose: Nose normal.  Eyes:     General:        Right eye: No discharge.        Left eye: No discharge.  Cardiovascular:     Rate and Rhythm: Normal rate and regular rhythm.     Heart sounds: No murmur heard.   Pulmonary:  Effort: Pulmonary effort is normal.     Breath sounds: Normal breath sounds.  Abdominal:     General: Bowel sounds are normal.     Palpations: Abdomen is soft.     Tenderness: There is no abdominal tenderness.  Musculoskeletal:        General: No edema.     Cervical back: Normal range of motion and neck supple.  Skin:    General: Skin is warm and dry.  Neurological:     Mental Status: She is alert and oriented to person, place, and time.  Psychiatric:        Mood and Affect: Mood and affect normal.     BP 110/68   Pulse 77   Temp 98 F (36.7 C) (Oral)   Resp 18   Wt 160 lb 3.2 oz (72.7 kg)   SpO2 97%   BMI 27.50 kg/m  Wt Readings from Last 3 Encounters:  04/12/20 160 lb 3.2 oz (72.7 kg)  12/28/19 155 lb 3.2 oz (70.4 kg)  11/04/19 190 lb (86.2 kg)    Diabetic Foot Exam - Simple   No data filed    Lab Results  Component Value Date   WBC 7.8 12/28/2019   HGB 12.3 12/28/2019   HCT 38.8 12/28/2019   PLT 230 12/28/2019   GLUCOSE 101 (H) 12/28/2019   CHOL 96 06/23/2019   TRIG 72.0 06/23/2019   HDL 42.60 06/23/2019   LDLCALC 39 06/23/2019   ALT 9 06/23/2019   AST 16 06/23/2019   NA 139 12/28/2019   K 4.3 12/28/2019   CL 105 12/28/2019   CREATININE 1.13 (H) 12/28/2019   BUN 26 12/28/2019   CO2 20 12/28/2019   TSH 2.85 06/23/2019   HGBA1C 6.0 06/23/2019    Lab Results  Component Value Date   TSH 2.85 06/23/2019   Lab Results  Component Value Date   WBC 7.8 12/28/2019   HGB 12.3 12/28/2019   HCT 38.8 12/28/2019   MCV 99 (H) 12/28/2019   PLT 230 12/28/2019   Lab Results  Component Value Date   NA 139  12/28/2019   K 4.3 12/28/2019   CO2 20 12/28/2019   GLUCOSE 101 (H) 12/28/2019   BUN 26 12/28/2019   CREATININE 1.13 (H) 12/28/2019   BILITOT 1.0 06/23/2019   ALKPHOS 55 06/23/2019   AST 16 06/23/2019   ALT 9 06/23/2019   PROT 6.7 06/23/2019   ALBUMIN 4.2 06/23/2019   CALCIUM 10.1 12/28/2019   GFR 54.63 (L) 06/23/2019   Lab Results  Component Value Date   CHOL 96 06/23/2019   Lab Results  Component Value Date   HDL 42.60 06/23/2019   Lab Results  Component Value Date   LDLCALC 39 06/23/2019   Lab Results  Component Value Date   TRIG 72.0 06/23/2019   Lab Results  Component Value Date   CHOLHDL 2 06/23/2019   Lab Results  Component Value Date   HGBA1C 6.0 06/23/2019       Assessment & Plan:   Problem List Items Addressed This Visit    Hypertension - Primary    Well controlled, no changes to meds. Encouraged heart healthy diet such as the DASH diet and exercise as tolerated.       Relevant Orders   CBC   TSH   High cholesterol    Tolerating statin, encouraged heart healthy diet, avoid trans fats, minimize simple carbs and saturated fats. Increase exercise as tolerated      Relevant  Orders   Lipid panel   Diabetes mellitus with diabetic nephropathy (HCC)    Her blood sugars have remained between 80 to 120 even off of Metformin she will remain off of it for now.       Relevant Orders   Hemoglobin A1c   Comprehensive metabolic panel   Vitamin D deficiency   Relevant Orders   VITAMIN D 25 Hydroxy (Vit-D Deficiency, Fractures)   Diarrhea    Stopped Metformin but diarrhea has not improved so we will try Questran 2 gm tid and titrate as needed. Avoid rich and spicy foods, report if any bloody or fatty stool develops.       Depression    She is less active and sleeping more but she does not feel it is from depression and she does not want to address with medication changes.        Other Visit Diagnoses    Urinary urgency       Relevant Orders    Urinalysis   Urine Culture      I have discontinued Thomas Marchio's metFORMIN. I am also having her start on azelastine. Additionally, I am having her maintain her potassium chloride SA, pravastatin, ondansetron, loperamide, Polyvinyl Alcohol-Povidone (REFRESH OP), Cholecalciferol, Biofreeze, metoprolol succinate, furosemide, acetaminophen, levothyroxine, famotidine, apixaban, losartan, FLUoxetine, and levETIRAcetam.  Meds ordered this encounter  Medications  . azelastine (ASTELIN) 0.1 % nasal spray    Sig: Place 2 sprays into both nostrils 2 (two) times daily. Use in each nostril as directed    Dispense:  30 mL    Refill:  12  . DISCONTD: cholestyramine (QUESTRAN) 4 GM/DOSE powder    Sig: Take 0.5 packets (2 g total) by mouth 3 (three) times daily with meals.    Dispense:  378 g    Refill:  5     Danise Edge, MD

## 2020-04-13 ENCOUNTER — Other Ambulatory Visit: Payer: Self-pay | Admitting: Family Medicine

## 2020-04-13 ENCOUNTER — Telehealth: Payer: Self-pay | Admitting: Family Medicine

## 2020-04-13 MED ORDER — CHOLESTYRAMINE 4 GM/DOSE PO POWD: 2.0000 g | Freq: Three times a day (TID) | ORAL | 5 refills | Status: DC

## 2020-04-13 MED ORDER — CHOLESTYRAMINE POWD: 2.0000 g | Freq: Three times a day (TID) | 5 refills | Status: DC

## 2020-04-13 NOTE — Assessment & Plan Note (Signed)
Tolerating statin, encouraged heart healthy diet, avoid trans fats, minimize simple carbs and saturated fats. Increase exercise as tolerated 

## 2020-04-13 NOTE — Telephone Encounter (Signed)
Caller: Sharyl Nimrod from [facility] pharmacy   Caller call back [phone]  she suggest that a 1/2 of scoop of the cholestyramine (QUESTRAN to be giving instead of 1/2 a packet it would be easier for the med tech to measure out

## 2020-04-13 NOTE — Assessment & Plan Note (Signed)
She is less active and sleeping more but she does not feel it is from depression and she does not want to address with medication changes.

## 2020-04-13 NOTE — Assessment & Plan Note (Signed)
Stopped Metformin but diarrhea has not improved so we will try Questran 2 gm tid and titrate as needed. Avoid rich and spicy foods, report if any bloody or fatty stool develops.

## 2020-04-13 NOTE — Telephone Encounter (Signed)
Please advise 

## 2020-04-13 NOTE — Telephone Encounter (Signed)
Sent the new prescription to the pharmacy

## 2020-04-13 NOTE — Assessment & Plan Note (Signed)
Her blood sugars have remained between 80 to 120 even off of Metformin she will remain off of it for now.

## 2020-04-13 NOTE — Assessment & Plan Note (Signed)
Well controlled, no changes to meds. Encouraged heart healthy diet such as the DASH diet and exercise as tolerated.  °

## 2020-04-15 ENCOUNTER — Telehealth: Payer: Self-pay | Admitting: Family Medicine

## 2020-04-15 NOTE — Telephone Encounter (Signed)
Caller: Martie Lee [facility]) Call back # [phone]  States they faxed an order which they need it back today

## 2020-04-18 NOTE — Telephone Encounter (Signed)
Called [place] but Martie Lee was unavailable and I left a message to call me back.

## 2020-04-19 ENCOUNTER — Other Ambulatory Visit: Payer: Self-pay | Admitting: *Deleted

## 2020-04-19 MED ORDER — CHOLESTYRAMINE POWD: 5 refills | Status: DC

## 2020-04-19 NOTE — Telephone Encounter (Signed)
Spoke with Erin Steele and she let me know that she need both of the papers signed and faxed. Papers were faxed

## 2020-05-06 ENCOUNTER — Telehealth: Payer: Self-pay

## 2020-05-06 ENCOUNTER — Encounter: Payer: Self-pay | Admitting: Family Medicine

## 2020-05-06 NOTE — Telephone Encounter (Signed)
New form from [facility] has been placed in the yellow folder for you to finish completing.

## 2020-05-09 NOTE — Telephone Encounter (Signed)
Form done 

## 2020-05-10 NOTE — Telephone Encounter (Signed)
Patient daughter contacted and form emailed to daughter.

## 2020-06-02 ENCOUNTER — Telehealth: Payer: Self-pay | Admitting: Family Medicine

## 2020-06-02 NOTE — Telephone Encounter (Signed)
Spoke with Martie Lee at [facility] and she stated that they found the paperwork.

## 2020-06-02 NOTE — Telephone Encounter (Signed)
Caller: Martie Lee [facility] )  Call back # [phone]  They fax an order on 05/27/20 (order summary report). They need order signed today.

## 2020-10-26 IMAGING — CT CT HEAD W/O CM
3 series · 16 of 47 positions shown, 19 images · non-contrast
Comparison: Outside imaging is unavailable.

CLINICAL DATA: Fall with subdural hematoma, follow-up

EXAM:
CT HEAD WITHOUT CONTRAST
TECHNIQUE: Contiguous axial images were obtained from the base of the skull
through the vertex without intravenous contrast.

[Series 2: head wo · axial · 0.43mm/px · z∈[-150,-25]mm · 10 of 31 slices shown, 13 images]
[im 3/31  brain]
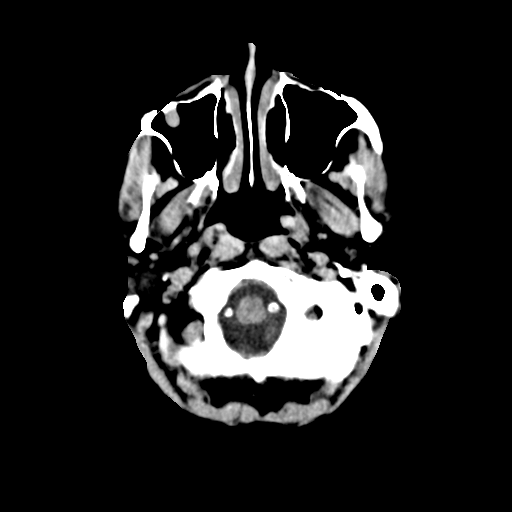
[im 3/31  bone]
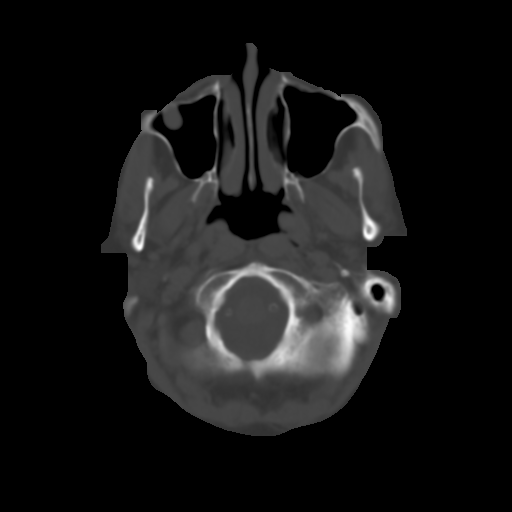
[im 6/31  brain]
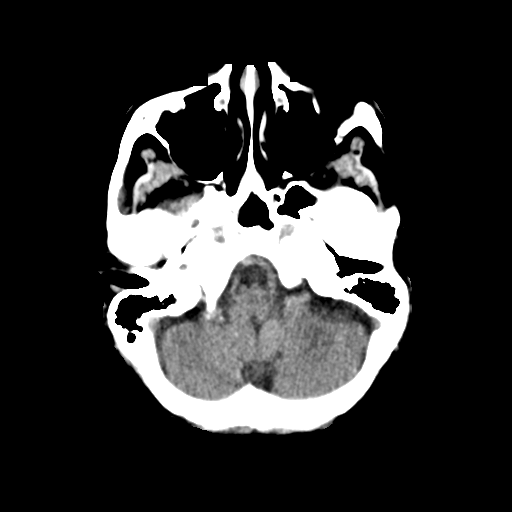
[im 9/31  brain]
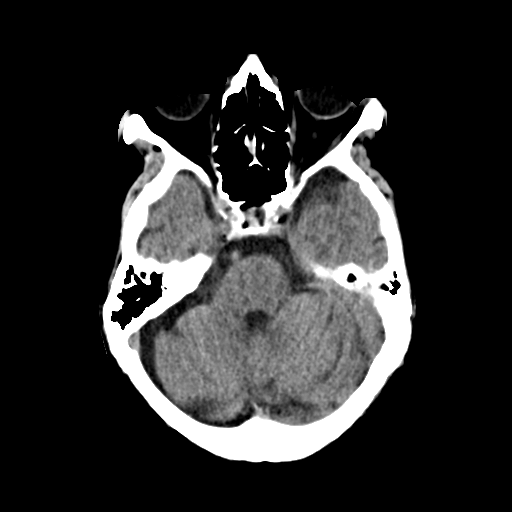
[im 11/31  brain]
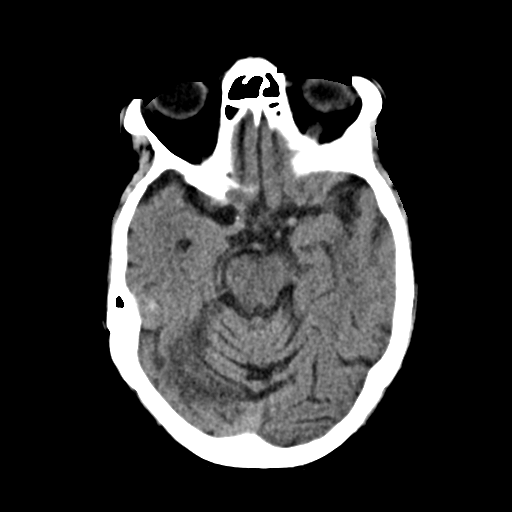
[im 14/31  brain]
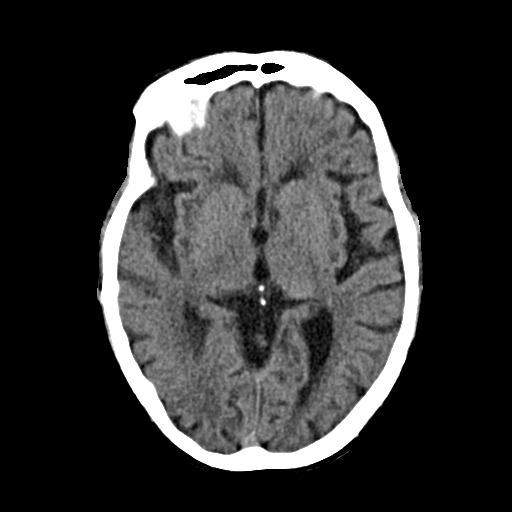
[im 14/31  bone]
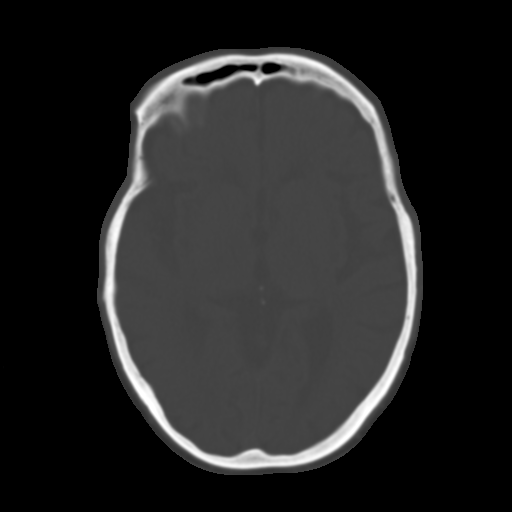
[im 17/31  brain]
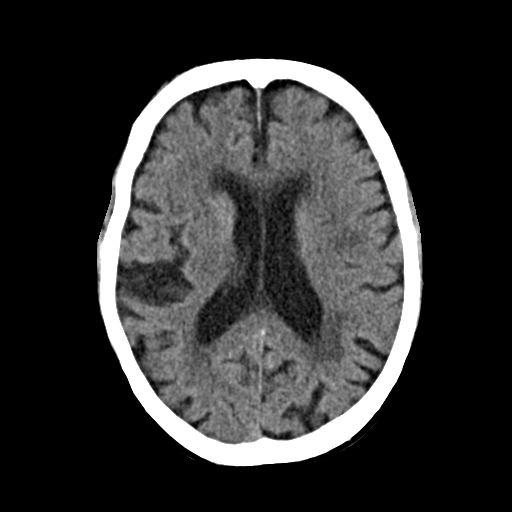
[im 20/31  brain]
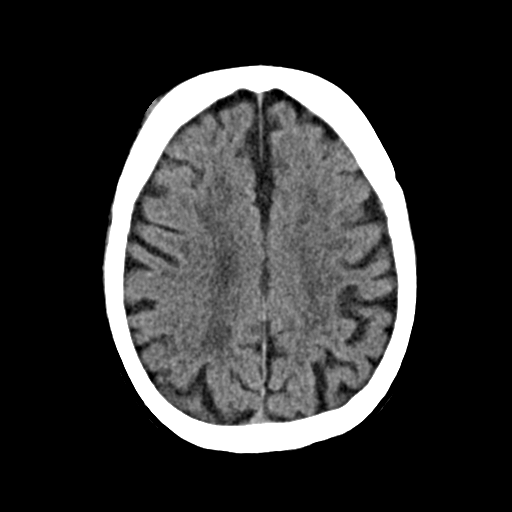
[im 23/31  brain]
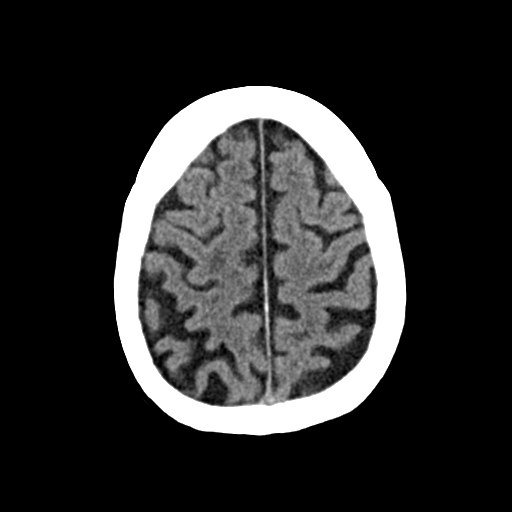
[im 25/31  brain]
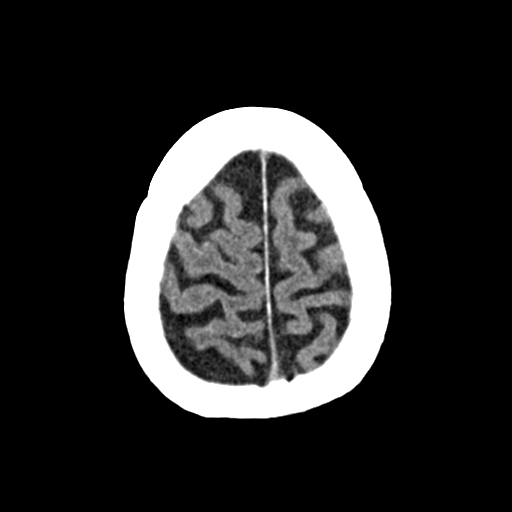
[im 25/31  bone]
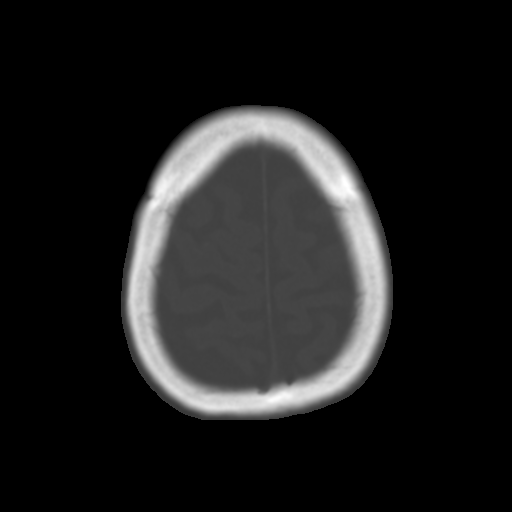
[im 28/31  brain]
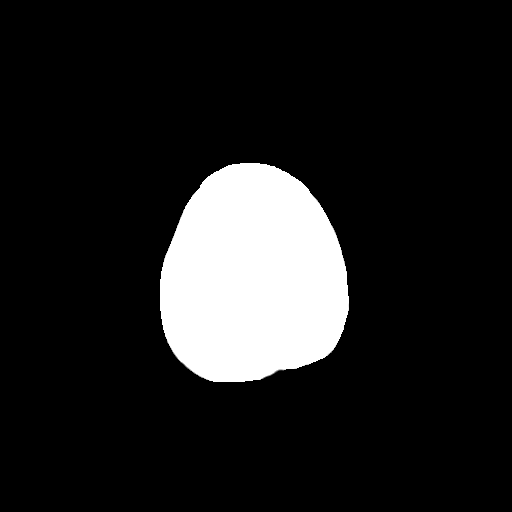

[Series 4: coronal soft · coronal · 0.35mm/px · 3 of 67 slices shown]
[im 23/67  brain]
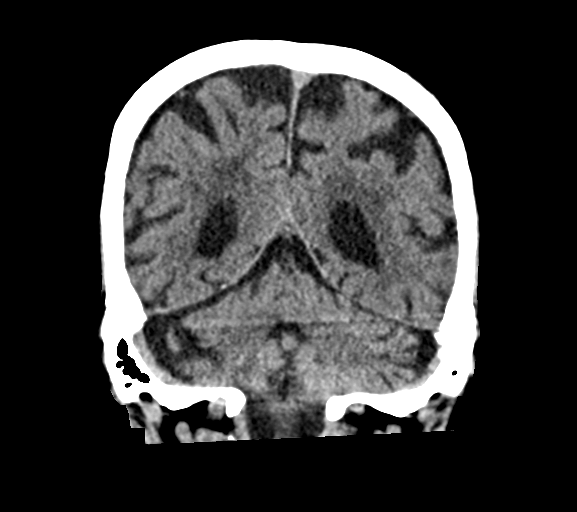
[im 30/67  brain]
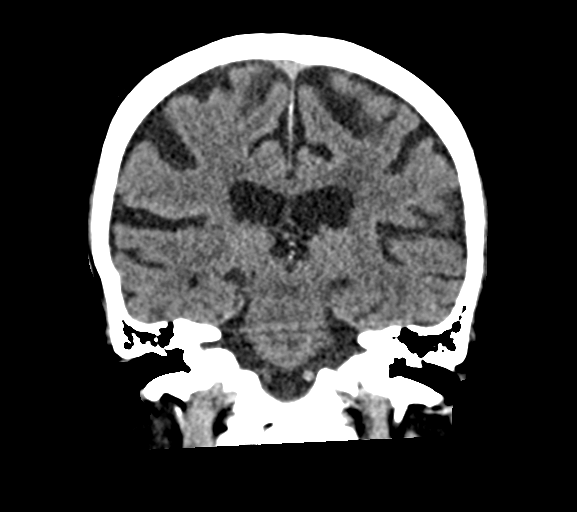
[im 37/67  brain]
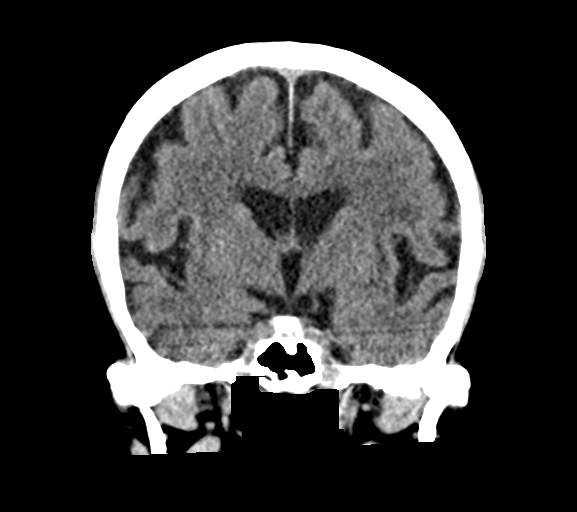

[Series 5: sag soft · sagittal · 0.35mm/px · 3 of 57 slices shown]
[im 19/57  brain]
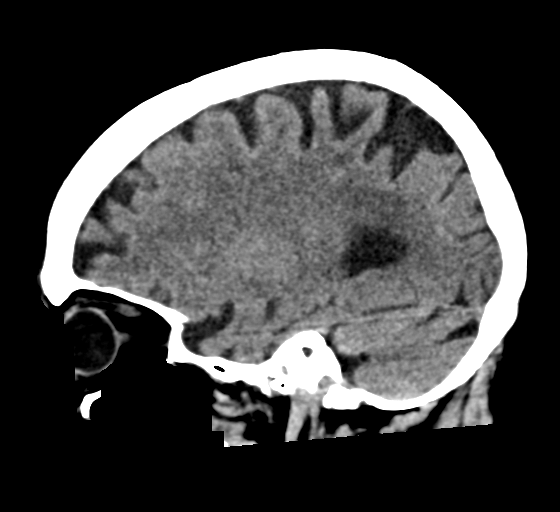
[im 29/57  brain]
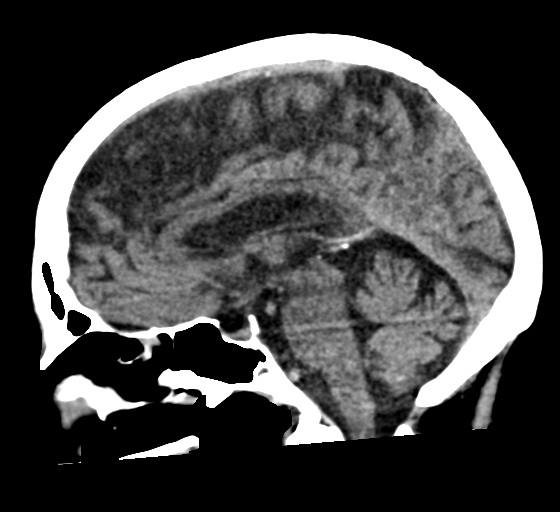
[im 38/57  brain]
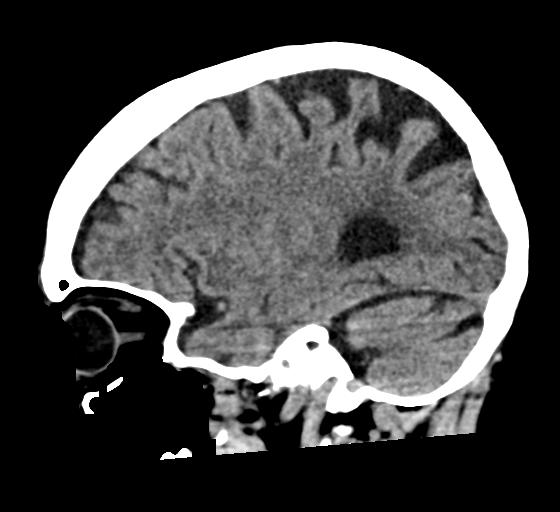

[16 of 47 positions shown; findings below may reference images not displayed]

FINDINGS: Brain: There is no acute intracranial hemorrhage, mass effect, or
edema. Gray-white differentiation is preserved. There is no
extra-axial fluid collection. Prominence of the ventricles and sulci
reflects mild generalized parenchymal volume loss. Patchy
hypoattenuation in the supratentorial white matter is nonspecific
but may reflect mild to moderate chronic microvascular ischemic
changes.

Vascular: There is atherosclerotic calcification at the skull base.

Skull: Calvarium is unremarkable. Degenerative changes at the
temporomandibular joints.

Sinuses/Orbits: Minor mucosal thickening. No significant osseous
abnormality.

Other: Minor right scalp soft tissue swelling.
IMPRESSION: There is no subdural hematoma identified. Chronic/nonemergent
findings detailed above.

## 2020-12-26 ENCOUNTER — Telehealth: Payer: Self-pay | Admitting: Family Medicine

## 2020-12-26 NOTE — Telephone Encounter (Signed)
Erin Steele from [facility] called and stated the pt would like to change her dosage from 3 times a day too 2 times a day for Cholestyramine POWD    If any new questions develop you may contact the number listed below.   [place]: [phone]

## 2020-12-27 NOTE — Telephone Encounter (Signed)
Signed paper yesterday

## 2021-01-19 ENCOUNTER — Encounter: Payer: Self-pay | Admitting: Family Medicine

## 2021-01-19 ENCOUNTER — Other Ambulatory Visit: Payer: Self-pay

## 2021-01-19 ENCOUNTER — Ambulatory Visit (INDEPENDENT_AMBULATORY_CARE_PROVIDER_SITE_OTHER): Payer: PPO | Admitting: Family Medicine

## 2021-01-19 VITALS — BP 102/66 | HR 120 | Temp 98.0°F | Resp 16 | Wt 163.8 lb

## 2021-01-19 DIAGNOSIS — E7800 Pure hypercholesterolemia, unspecified: Secondary | ICD-10-CM

## 2021-01-19 DIAGNOSIS — R252 Cramp and spasm: Secondary | ICD-10-CM | POA: Diagnosis not present

## 2021-01-19 DIAGNOSIS — E1121 Type 2 diabetes mellitus with diabetic nephropathy: Secondary | ICD-10-CM

## 2021-01-19 DIAGNOSIS — I2510 Atherosclerotic heart disease of native coronary artery without angina pectoris: Secondary | ICD-10-CM

## 2021-01-19 DIAGNOSIS — E559 Vitamin D deficiency, unspecified: Secondary | ICD-10-CM

## 2021-01-19 DIAGNOSIS — I10 Essential (primary) hypertension: Secondary | ICD-10-CM | POA: Diagnosis not present

## 2021-01-19 DIAGNOSIS — F32A Depression, unspecified: Secondary | ICD-10-CM | POA: Diagnosis not present

## 2021-01-19 MED ORDER — FAMOTIDINE 20 MG PO TABS: 40.0000 mg | ORAL_TABLET | Freq: Every day | ORAL | 1 refills | Status: DC

## 2021-01-19 MED ORDER — FLUOXETINE HCL 10 MG PO TABS: 10.0000 mg | ORAL_TABLET | Freq: Every day | ORAL | 3 refills | Status: DC

## 2021-01-19 NOTE — Patient Instructions (Signed)
Muscle Cramps and Spasms Muscle cramps and spasms occur when a muscle or muscles tighten and you have no control over this tightening (involuntary muscle contraction). They are a common problem and can develop in any muscle. The most common place is in the calf muscles of the leg. Muscle cramps and muscle spasms are both involuntary muscle contractions, but there are some differences between the two: Muscle cramps are painful. They come and go and may last for a few seconds or up to 15 minutes. Muscle cramps are often more forceful and last longer than muscle spasms. Muscle spasms may or may not be painful. They may also last just a few seconds or much longer. Certain medical conditions, such as diabetes or Parkinson's disease, can make it more likely to develop cramps or spasms. However, cramps or spasms are usually not caused by a serious underlying problem. Common causes include: Doing more physical work or exercise than your body is ready for (overexertion). Overuse from repeating certain movements too many times. Remaining in a certain position for a long period of time. Improper preparation, form, or technique while playing a sport or doing an activity. Dehydration. Injury. Side effects of some medicines. Abnormally low levels of the salts and minerals in your blood (electrolytes), especially potassium and calcium. This could happen if you are taking water pills (diuretics) or if you are pregnant. In many cases, the cause of muscle cramps or spasms is not known. Follow these instructions at home: Managing pain and stiffness     Try massaging, stretching, and relaxing the affected muscle. Do this for several minutes at a time. If directed, apply heat to tight or tense muscles as often as told by your health care provider. Use the heat source that your health care provider recommends, such as a moist heat pack or a heating pad. Place a towel between your skin and the heat source. Leave the  heat on for 20-30 minutes. Remove the heat if your skin turns bright red. This is especially important if you are unable to feel pain, heat, or cold. You may have a greater risk of getting burned. If directed, put ice on the affected area. This may help if you are sore or have pain after a cramp or spasm. Put ice in a plastic bag. Place a towel between your skin and the bag. Leave the ice on for 20 minutes, 2-3 times a day. Try taking hot showers or baths to help relax tight muscles. Eating and drinking Drink enough fluid to keep your urine pale yellow. Staying well hydrated may help prevent cramps or spasms. Eat a healthy diet that includes plenty of nutrients to help your muscles function. A healthy diet includes fruits and vegetables, lean protein, whole grains, and low-fat or nonfat dairy products. General instructions If you are having frequent cramps, avoid intense exercise for several days. Take over-the-counter and prescription medicines only as told by your health care provider. Pay attention to any changes in your symptoms. Keep all follow-up visits as told by your health care provider. This is important. Contact a health care provider if: Your cramps or spasms get more severe or happen more often. Your cramps or spasms do not improve over time. Summary Muscle cramps and spasms occur when a muscle or muscles tighten and you have no control over this tightening (involuntary muscle contraction). The most common place for cramps or spasms to occur is in the calf muscles of the leg. Massaging, stretching, and relaxing the affected   muscle may relieve the cramp or spasm. Drink enough fluid to keep your urine pale yellow. Staying well hydrated may help prevent cramps or spasms. This information is not intended to replace advice given to you by your health care provider. Make sure you discuss any questions you have with your healthcare provider. Document Revised: 08/19/2017 Document  Reviewed: 08/19/2017 Elsevier Patient Education  2022 Elsevier Inc.  

## 2021-01-19 NOTE — Progress Notes (Signed)
Patient ID: Erin Steele, female    DOB: 1927-05-22  Age: 87 y.o. MRN: [id number]    Subjective:   Chief Complaint  Patient presents with   Leg Pain    both   sleep issue    Bad dreams   Subjective   HPI Erin Steele presents for office visit today for follow up on HTN and type 2 diabetes. She is expressing concern that Fluoxetine 20 mg is causing her sleep issues due to unpleasant dreams. Denies CP/palp/SOB/HA/congestion/fevers or GU c/o. Taking meds as prescribed.  She has been experiencing bad leg cramps that are painful. She has about 2 cups of coffee in the morning with some water. She gets about 5-6 water cups a day. She experiences leg cramps 4x weekly. She feels the cramps in hips, thighs, and ankles bilaterally. She experiences neuropathy as well which results in burning sensation in bilateral legs when it is bad enough.  She reports more frequent heart burns, trouble swallowing, or sensation of food catching. It has been on-going for 6 months now. She tried taking an anti-acid for her symptoms.   Review of Systems  Constitutional:  Negative for chills, fatigue and fever.  HENT:  Positive for trouble swallowing. Negative for congestion, rhinorrhea, sinus pressure, sinus pain and sore throat.   Eyes:  Negative for pain.  Respiratory:  Negative for cough and shortness of breath.   Cardiovascular:  Negative for chest pain, palpitations and leg swelling.  Gastrointestinal:  Negative for abdominal pain, blood in stool, diarrhea, nausea and vomiting.  Genitourinary:  Negative for decreased urine volume, flank pain, frequency, vaginal bleeding and vaginal discharge.  Musculoskeletal:  Negative for back pain.  Neurological:  Negative for headaches.   History Past Medical History:  Diagnosis Date   A-fib [facility])    Atrial fibrillation (HCC)    Bilateral rotator cuff dysfunction    CAD (coronary artery disease)    CHF (congestive heart failure) (HCC)    Diabetes  mellitus with diabetic nephropathy (HCC)    Diabetes mellitus without complication (HCC)    High cholesterol    HIV infection ([place])    Hypercalcemia 03/25/2018   Hyperlipemia    Hypertension    Hypokalemia    Osteoarthritis    Vitamin D deficiency     She has a past surgical history that includes Abdominal hysterectomy; Appendectomy; Ankle surgery; intestinal surgery ; chicken pox; measles; and Kidney stone surgery.   Her family history includes COPD in her mother; Cancer in her sister; Heart disease in her daughter, daughter, maternal grandfather, maternal grandmother, and sister.She reports that she has never smoked. She has never used smokeless tobacco. She reports current alcohol use. She reports that she does not use drugs.  Current Outpatient Medications on File Prior to Visit  Medication Sig Dispense Refill   acetaminophen (TYLENOL) 500 MG tablet Take 500 mg by mouth 3 (three) times daily as needed for mild pain, moderate pain, fever or headache.     apixaban (ELIQUIS) 5 MG TABS tablet Take 1 tablet (5 mg total) by mouth 2 (two) times daily. 10 tablet 0   azelastine (ASTELIN) 0.1 % nasal spray Place 2 sprays into both nostrils 2 (two) times daily. Use in each nostril as directed 30 mL 12   BIOFREEZE 4 % GEL Apply topically as needed (shoulders).     Cholecalciferol 3000 units TABS Take by mouth 2 (two) times daily.     Cholestyramine POWD Use 1/2 scoop TID with meals 200 g 5  furosemide (LASIX) 20 MG tablet Take 20 mg by mouth daily.     levothyroxine (SYNTHROID, LEVOTHROID) 25 MCG tablet Take 25 mcg by mouth daily before breakfast.     loperamide (IMODIUM) 2 MG capsule Take 2 mg by mouth as needed for diarrhea or loose stools.     losartan (COZAAR) 25 MG tablet Take 25 mg by mouth daily.     metoprolol succinate (TOPROL-XL) 25 MG 24 hr tablet Take 25 mg by mouth daily.     ondansetron (ZOFRAN) 4 MG tablet Take 4 mg by mouth every 8 (eight) hours as needed for nausea or vomiting.      Polyvinyl Alcohol-Povidone (REFRESH OP) Apply 1 drop to eye 4 (four) times daily.     potassium chloride SA (K-DUR) 20 MEQ tablet Take one tablet by mouth daily 90 tablet 12   pravastatin (PRAVACHOL) 20 MG tablet Take 20 mg by mouth daily.     levETIRAcetam (KEPPRA) 500 MG tablet Take by mouth.     No current facility-administered medications on file prior to visit.     Objective:  Objective  Physical Exam Constitutional:      General: She is not in acute distress.    Appearance: Normal appearance. She is not ill-appearing or toxic-appearing.  HENT:     Head: Normocephalic and atraumatic.     Right Ear: Tympanic membrane, ear canal and external ear normal.     Left Ear: Tympanic membrane, ear canal and external ear normal.     Nose: No congestion or rhinorrhea.  Eyes:     Extraocular Movements: Extraocular movements intact.     Pupils: Pupils are equal, round, and reactive to light.  Cardiovascular:     Rate and Rhythm: Normal rate and regular rhythm.     Pulses: Normal pulses.     Heart sounds: Normal heart sounds. No murmur heard. Pulmonary:     Effort: Pulmonary effort is normal. No respiratory distress.     Breath sounds: Normal breath sounds. No wheezing, rhonchi or rales.  Abdominal:     General: Bowel sounds are normal.     Palpations: Abdomen is soft. There is no mass.     Tenderness: There is no abdominal tenderness. There is no guarding.     Hernia: No hernia is present.  Musculoskeletal:        General: Normal range of motion.     Cervical back: Normal range of motion and neck supple.  Skin:    General: Skin is warm and dry.  Neurological:     Mental Status: She is alert and oriented to person, place, and time.  Psychiatric:        Behavior: Behavior normal.   BP 102/66   Pulse (!) 120   Temp 98 F (36.7 C)   Resp 16   Wt 163 lb 12.8 oz (74.3 kg)   SpO2 98%   BMI 28.12 kg/m  Wt Readings from Last 3 Encounters:  01/19/21 163 lb 12.8 oz (74.3 kg)   04/12/20 160 lb 3.2 oz (72.7 kg)  12/28/19 155 lb 3.2 oz (70.4 kg)     Lab Results  Component Value Date   WBC 6.8 01/19/2021   HGB 12.6 01/19/2021   HCT 39.0 01/19/2021   PLT 216.0 01/19/2021   GLUCOSE 102 (H) 01/19/2021   CHOL 90 01/19/2021   TRIG 203.0 (H) 01/19/2021   HDL 41.70 01/19/2021   LDLDIRECT 28.0 01/19/2021   LDLCALC 39 06/23/2019   ALT 12 01/19/2021  AST 21 01/19/2021   NA 140 01/19/2021   K 4.2 01/19/2021   CL 110 01/19/2021   CREATININE 1.25 (H) 01/19/2021   BUN 25 (H) 01/19/2021   CO2 20 01/19/2021   TSH 6.41 (H) 01/19/2021   HGBA1C 5.9 01/19/2021    CT Head Wo Contrast  Result Date: 11/06/2019 CLINICAL DATA:  Fall with subdural hematoma, follow-up EXAM: CT HEAD WITHOUT CONTRAST TECHNIQUE: Contiguous axial images were obtained from the base of the skull through the vertex without intravenous contrast. COMPARISON:  Outside imaging is unavailable. FINDINGS: Brain: There is no acute intracranial hemorrhage, mass effect, or edema. Gray-white differentiation is preserved. There is no extra-axial fluid collection. Prominence of the ventricles and sulci reflects mild generalized parenchymal volume loss. Patchy hypoattenuation in the supratentorial white matter is nonspecific but may reflect mild to moderate chronic microvascular ischemic changes. Vascular: There is atherosclerotic calcification at the skull base. Skull: Calvarium is unremarkable. Degenerative changes at the temporomandibular joints. Sinuses/Orbits: Minor mucosal thickening. No significant osseous abnormality. Other: Minor right scalp soft tissue swelling. IMPRESSION: There is no subdural hematoma identified. Chronic/nonemergent findings detailed above. Electronically Signed   By: Macy Mis M.D.   On: 11/06/2019 15:49     Assessment & Plan:  Plan    Meds ordered this encounter  Medications   FLUoxetine (PROZAC) 10 MG tablet    Sig: Take 1 tablet (10 mg total) by mouth daily.    Dispense:  90  tablet    Refill:  3   DISCONTD: famotidine (PEPCID) 20 MG tablet    Sig: Take 2 tablets (40 mg total) by mouth at bedtime.    Dispense:  180 tablet    Refill:  1   famotidine (PEPCID) 20 MG tablet    Sig: Take 2 tablets (40 mg total) by mouth at bedtime.    Dispense:  180 tablet    Refill:  1     Problem List Items Addressed This Visit     Hypertension - Primary    Well controlled, no changes to meds. Encouraged heart healthy diet such as the DASH diet and exercise as tolerated.       Relevant Orders   CBC (Completed)   Comprehensive metabolic panel (Completed)   TSH (Completed)   High cholesterol    Tolerating statin, encouraged heart healthy diet, avoid trans fats, minimize simple carbs and saturated fats. Increase exercise as tolerated      Relevant Orders   Lipid panel (Completed)   Diabetes mellitus with diabetic nephropathy (HCC)    hgba1c acceptable, minimize simple carbs. Increase exercise as tolerated. Continue current meds      Relevant Orders   Hemoglobin A1c (Completed)   Vitamin D deficiency    Supplement and monitor      Relevant Orders   VITAMIN D 25 Hydroxy (Vit-D Deficiency, Fractures) (Completed)   CAD (coronary artery disease)    She is offered back to cardiology for evaluation of medications but declines for now.      Depression    She is worried that the Fluoxetine is causing some bad dreams she is having so we will drop the medication to 10 mg daily for now and reevaluate.       Relevant Medications   FLUoxetine (PROZAC) 10 MG tablet   Muscle cramps    She hydrates well and will continue to do so. Will check labs and she notes she has had good results using H[facility]'s leg cramp medicine in the past. Is  given a prescription to try again.       Relevant Orders   Comprehensive metabolic panel (Completed)   Magnesium (Completed)    Follow-up: Return in about 3 months (around 04/21/2021) for f/u visit.  I, Suezanne Jacquet, acting as a scribe  for Penni Homans, MD, have documented all relevent documentation on behalf of Penni Homans, MD, as directed by Penni Homans, MD while in the presence of Penni Homans, MD. DO:01/20/21.  I, Mosie Lukes, MD personally performed the services described in this documentation. All medical record entries made by the scribe were at my direction and in my presence. I have reviewed the chart and agree that the record reflects my personal performance and is accurate and complete

## 2021-01-20 ENCOUNTER — Ambulatory Visit: Payer: PPO | Admitting: Family Medicine

## 2021-01-20 DIAGNOSIS — R252 Cramp and spasm: Secondary | ICD-10-CM | POA: Insufficient documentation

## 2021-01-20 LAB — COMPREHENSIVE METABOLIC PANEL
ALT: 12 U/L (ref 0–35)
AST: 21 U/L (ref 0–37)
Albumin: 4.5 g/dL (ref 3.5–5.2)
Alkaline Phosphatase: 70 U/L (ref 39–117)
BUN: 25 mg/dL — ABNORMAL HIGH (ref 6–23)
CO2: 20 mEq/L (ref 19–32)
Calcium: 9.7 mg/dL (ref 8.4–10.5)
Chloride: 110 mEq/L (ref 96–112)
Creatinine, Ser: 1.25 mg/dL — ABNORMAL HIGH (ref 0.40–1.20)
GFR: 37.73 mL/min — ABNORMAL LOW (ref >60.00)
Glucose, Bld: 102 mg/dL — ABNORMAL HIGH (ref 70–99)
Potassium: 4.2 mEq/L (ref 3.5–5.1)
Sodium: 140 mEq/L (ref 135–145)
Total Bilirubin: 0.5 mg/dL (ref 0.2–1.2)
Total Protein: 6.9 g/dL (ref 6.0–8.3)

## 2021-01-20 LAB — LIPID PANEL
Cholesterol: 90 mg/dL (ref 0–200)
HDL: 41.7 mg/dL (ref >39.00)
NonHDL: 48.55
Total CHOL/HDL Ratio: 2
Triglycerides: 203 mg/dL — ABNORMAL HIGH (ref 0.0–149.0)
VLDL: 40.6 mg/dL — ABNORMAL HIGH (ref 0.0–40.0)

## 2021-01-20 LAB — CBC
HCT: 39 % (ref 36.0–46.0)
Hemoglobin: 12.6 g/dL (ref 12.0–15.0)
MCHC: 32.4 g/dL (ref 30.0–36.0)
MCV: 98.5 fl (ref 78.0–100.0)
Platelets: 216 10*3/uL (ref 150.0–400.0)
RBC: 3.96 Mil/uL (ref 3.87–5.11)
RDW: 14.1 % (ref 11.5–15.5)
WBC: 6.8 10*3/uL (ref 4.0–10.5)

## 2021-01-20 LAB — VITAMIN D 25 HYDROXY (VIT D DEFICIENCY, FRACTURES): VITD: 42.49 ng/mL (ref 30.00–100.00)

## 2021-01-20 LAB — MAGNESIUM: Magnesium: 1.9 mg/dL (ref 1.5–2.5)

## 2021-01-20 LAB — LDL CHOLESTEROL, DIRECT: Direct LDL: 28 mg/dL

## 2021-01-20 LAB — HEMOGLOBIN A1C: Hgb A1c MFr Bld: 5.9 % (ref 4.6–6.5)

## 2021-01-20 LAB — TSH: TSH: 6.41 u[IU]/mL — ABNORMAL HIGH (ref 0.35–5.50)

## 2021-01-20 NOTE — Assessment & Plan Note (Signed)
Well controlled, no changes to meds. Encouraged heart healthy diet such as the DASH diet and exercise as tolerated.  °

## 2021-01-20 NOTE — Assessment & Plan Note (Signed)
hgba1c acceptable, minimize simple carbs. Increase exercise as tolerated. Continue current meds 

## 2021-01-20 NOTE — Assessment & Plan Note (Signed)
Supplement and monitor 

## 2021-01-20 NOTE — Assessment & Plan Note (Signed)
Tolerating statin, encouraged heart healthy diet, avoid trans fats, minimize simple carbs and saturated fats. Increase exercise as tolerated 

## 2021-01-20 NOTE — Assessment & Plan Note (Signed)
She hydrates well and will continue to do so. Will check labs and she notes she has had good results using Hyland's leg cramp medicine in the past. Is given a prescription to try again.

## 2021-01-20 NOTE — Assessment & Plan Note (Signed)
She is offered back to cardiology for evaluation of medications but declines for now.

## 2021-01-20 NOTE — Assessment & Plan Note (Signed)
She is worried that the Fluoxetine is causing some bad dreams she is having so we will drop the medication to 10 mg daily for now and reevaluate.

## 2021-01-25 ENCOUNTER — Telehealth: Payer: Self-pay

## 2021-01-25 NOTE — Telephone Encounter (Signed)
Spoke with Martie Lee from [place], she needs a couple things classified from paperwork from visit on 01/19/2021. She is faxing paperwork over.

## 2021-04-20 ENCOUNTER — Ambulatory Visit (INDEPENDENT_AMBULATORY_CARE_PROVIDER_SITE_OTHER): Payer: PPO | Admitting: Family Medicine

## 2021-04-20 ENCOUNTER — Encounter: Payer: Self-pay | Admitting: Family Medicine

## 2021-04-20 VITALS — BP 104/66 | HR 82 | Temp 98.0°F | Resp 16 | Ht 64.0 in | Wt 162.0 lb

## 2021-04-20 DIAGNOSIS — I4891 Unspecified atrial fibrillation: Secondary | ICD-10-CM

## 2021-04-20 DIAGNOSIS — E7800 Pure hypercholesterolemia, unspecified: Secondary | ICD-10-CM

## 2021-04-20 DIAGNOSIS — E1121 Type 2 diabetes mellitus with diabetic nephropathy: Secondary | ICD-10-CM | POA: Diagnosis not present

## 2021-04-20 DIAGNOSIS — R252 Cramp and spasm: Secondary | ICD-10-CM | POA: Diagnosis not present

## 2021-04-20 DIAGNOSIS — E559 Vitamin D deficiency, unspecified: Secondary | ICD-10-CM | POA: Diagnosis not present

## 2021-04-20 DIAGNOSIS — I10 Essential (primary) hypertension: Secondary | ICD-10-CM

## 2021-04-20 DIAGNOSIS — E039 Hypothyroidism, unspecified: Secondary | ICD-10-CM

## 2021-04-20 LAB — HEMOGLOBIN A1C: Hgb A1c MFr Bld: 6 % (ref 4.6–6.5)

## 2021-04-20 LAB — MAGNESIUM: Magnesium: 1.7 mg/dL (ref 1.5–2.5)

## 2021-04-20 LAB — LIPID PANEL
Cholesterol: 94 mg/dL (ref 0–200)
HDL: 46.7 mg/dL (ref >39.00)
LDL Cholesterol: 31 mg/dL (ref 0–99)
NonHDL: 47.59
Total CHOL/HDL Ratio: 2
Triglycerides: 85 mg/dL (ref 0.0–149.0)
VLDL: 17 mg/dL (ref 0.0–40.0)

## 2021-04-20 LAB — VITAMIN D 25 HYDROXY (VIT D DEFICIENCY, FRACTURES): VITD: 44.35 ng/mL (ref 30.00–100.00)

## 2021-04-20 LAB — COMPREHENSIVE METABOLIC PANEL
ALT: 13 U/L (ref 0–35)
AST: 20 U/L (ref 0–37)
Albumin: 4.6 g/dL (ref 3.5–5.2)
Alkaline Phosphatase: 66 U/L (ref 39–117)
BUN: 25 mg/dL — ABNORMAL HIGH (ref 6–23)
CO2: 22 mEq/L (ref 19–32)
Calcium: 9.6 mg/dL (ref 8.4–10.5)
Chloride: 109 mEq/L (ref 96–112)
Creatinine, Ser: 1.05 mg/dL (ref 0.40–1.20)
GFR: 46.43 mL/min — ABNORMAL LOW (ref >60.00)
Glucose, Bld: 114 mg/dL — ABNORMAL HIGH (ref 70–99)
Potassium: 4.4 mEq/L (ref 3.5–5.1)
Sodium: 141 mEq/L (ref 135–145)
Total Bilirubin: 0.9 mg/dL (ref 0.2–1.2)
Total Protein: 7 g/dL (ref 6.0–8.3)

## 2021-04-20 LAB — CBC WITH DIFFERENTIAL/PLATELET
Basophils Absolute: 0 10*3/uL (ref 0.0–0.1)
Basophils Relative: 0.3 % (ref 0.0–3.0)
Eosinophils Absolute: 0.3 10*3/uL (ref 0.0–0.7)
Eosinophils Relative: 3.8 % (ref 0.0–5.0)
HCT: 39.5 % (ref 36.0–46.0)
Hemoglobin: 12.8 g/dL (ref 12.0–15.0)
Lymphocytes Relative: 27.6 % (ref 12.0–46.0)
Lymphs Abs: 2.1 10*3/uL (ref 0.7–4.0)
MCHC: 32.4 g/dL (ref 30.0–36.0)
MCV: 98.3 fl (ref 78.0–100.0)
Monocytes Absolute: 0.8 10*3/uL (ref 0.1–1.0)
Monocytes Relative: 10.8 % (ref 3.0–12.0)
Neutro Abs: 4.4 10*3/uL (ref 1.4–7.7)
Neutrophils Relative %: 57.5 % (ref 43.0–77.0)
Platelets: 231 10*3/uL (ref 150.0–400.0)
RBC: 4.02 Mil/uL (ref 3.87–5.11)
RDW: 14.1 % (ref 11.5–15.5)
WBC: 7.6 10*3/uL (ref 4.0–10.5)

## 2021-04-20 LAB — TSH: TSH: 4.56 u[IU]/mL (ref 0.35–5.50)

## 2021-04-20 NOTE — Assessment & Plan Note (Signed)
hgba1c acceptable, minimize simple carbs. Increase exercise as tolerated. Continue current meds. Had flu shot at her facility will request. Declines further covid shots, believes she had a tetanus shot about 4 years ago before she moved here. Advised to take one if she gets injured. Declines shingles shots

## 2021-04-20 NOTE — Assessment & Plan Note (Signed)
Improved on recheck, continue

## 2021-04-20 NOTE — Assessment & Plan Note (Signed)
On Levothyroxine, continue to monitor 

## 2021-04-20 NOTE — Assessment & Plan Note (Signed)
Supplement and monitor 

## 2021-04-20 NOTE — Assessment & Plan Note (Signed)
Hydrate and monitor 

## 2021-04-20 NOTE — Progress Notes (Signed)
Subjective:    Patient ID: Erin Steele, female    DOB: 12/16/1927, 88 y.o.   MRN: [id number]  Chief Complaint  Patient presents with   3 months follow up    HPI Patient is in today for follow up on chronic medical concerns. No recent febrile illness or hospitalizations. She is eating well and stays active at her home. They feel her memory is good and she manages her affairs well. Denies CP/palp/SOB/HA/congestion/fevers/GI or GU c/o. Taking meds as prescribed   Past Medical History:  Diagnosis Date   A-fib [facility])    Atrial fibrillation (HCC)    Bilateral rotator cuff dysfunction    CAD (coronary artery disease)    CHF (congestive heart failure) (HCC)    Diabetes mellitus with diabetic nephropathy (HCC)    Diabetes mellitus without complication (HCC)    High cholesterol    HIV infection ([place])    Hypercalcemia 03/25/2018   Hyperlipemia    Hypertension    Hypokalemia    Osteoarthritis    Vitamin D deficiency     Past Surgical History:  Procedure Laterality Date   ABDOMINAL HYSTERECTOMY     ANKLE SURGERY     and arms   APPENDECTOMY     chicken pox     intestinal surgery      for cancer   KIDNEY STONE SURGERY     measles      Family History  Problem Relation Age of Onset   COPD Mother        emphysema, nonsmoker   Heart disease Sister    Cancer Sister        lung CA in smoker   Heart disease Daughter    Heart disease Maternal Grandmother    Heart disease Maternal Grandfather    Heart disease Daughter     Social History   Socioeconomic History   Marital status: Married    Spouse name: Not on file   Number of children: Not on file   Years of education: Not on file   Highest education level: Not on file  Occupational History   Occupation: retired  Tobacco Use   Smoking status: Never   Smokeless tobacco: Never  Substance and Sexual Activity   Alcohol use: Yes    Comment: occasionally, mexican milkshake at Christmas   Drug use: No   Sexual  activity: Not on file  Other Topics Concern   Not on file  Social History Narrative       was a homemaker with many side jobs   Raised 4 children has relocated here from [facility] to be near family   Lives a [place] with husband   No dietary restrictions   Social Determinants of Radio broadcast assistant Strain: Not on file  Food Insecurity: Not on file  Transportation Needs: Not on file  Physical Activity: Not on file  Stress: Not on file  Social Connections: Not on file  Intimate Partner Violence: Not on file    Outpatient Medications Prior to Visit  Medication Sig Dispense Refill   acetaminophen (TYLENOL) 500 MG tablet Take 500 mg by mouth 3 (three) times daily as needed for mild pain, moderate pain, fever or headache.     apixaban (ELIQUIS) 5 MG TABS tablet Take 1 tablet (5 mg total) by mouth 2 (two) times daily. 10 tablet 0   azelastine (ASTELIN) 0.1 % nasal spray Place 2 sprays into both nostrils 2 (two) times daily. Use in each nostril as  directed 30 mL 12   BIOFREEZE 4 % GEL Apply topically as needed (shoulders).     Cholecalciferol 3000 units TABS Take by mouth 2 (two) times daily.     Cholestyramine POWD Use 1/2 scoop TID with meals 200 g 5   famotidine (PEPCID) 20 MG tablet Take 2 tablets (40 mg total) by mouth at bedtime. 180 tablet 1   FLUoxetine (PROZAC) 10 MG tablet Take 1 tablet (10 mg total) by mouth daily. 90 tablet 3   furosemide (LASIX) 20 MG tablet Take 20 mg by mouth daily.     levothyroxine (SYNTHROID, LEVOTHROID) 25 MCG tablet Take 25 mcg by mouth daily before breakfast.     loperamide (IMODIUM) 2 MG capsule Take 2 mg by mouth as needed for diarrhea or loose stools.     losartan (COZAAR) 25 MG tablet Take 25 mg by mouth daily.     metoprolol succinate (TOPROL-XL) 25 MG 24 hr tablet Take 25 mg by mouth daily.     ondansetron (ZOFRAN) 4 MG tablet Take 4 mg by mouth every 8 (eight) hours as needed for nausea or vomiting.     Polyvinyl Alcohol-Povidone  (REFRESH OP) Apply 1 drop to eye 4 (four) times daily.     potassium chloride SA (K-DUR) 20 MEQ tablet Take one tablet by mouth daily 90 tablet 12   pravastatin (PRAVACHOL) 20 MG tablet Take 20 mg by mouth daily.     levETIRAcetam (KEPPRA) 500 MG tablet Take by mouth.     No facility-administered medications prior to visit.    Allergies  Allergen Reactions   Morphine And Related Anxiety and Other (See Comments)    Pt is unsure of allergy-02/17/18 Pt is unsure of allergy-02/17/18 Pt is unsure of allergy-02/17/18    Penicillins Other (See Comments)    Doesn't remember reaction Doesn't remember reaction     Review of Systems  Constitutional:  Negative for fever and malaise/fatigue.  HENT:  Negative for congestion.   Eyes:  Negative for blurred vision.  Respiratory:  Negative for shortness of breath.   Cardiovascular:  Negative for chest pain, palpitations and leg swelling.  Gastrointestinal:  Negative for abdominal pain, blood in stool and nausea.  Genitourinary:  Negative for dysuria and frequency.  Musculoskeletal:  Positive for joint pain and myalgias. Negative for falls.  Skin:  Negative for rash.  Neurological:  Negative for dizziness, loss of consciousness and headaches.  Endo/Heme/Allergies:  Negative for environmental allergies.  Psychiatric/Behavioral:  Negative for depression. The patient is not nervous/anxious.       Objective:    Physical Exam Constitutional:      General: She is not in acute distress.    Appearance: She is well-developed.  HENT:     Head: Normocephalic and atraumatic.  Eyes:     Conjunctiva/sclera: Conjunctivae normal.  Neck:     Thyroid: No thyromegaly.  Cardiovascular:     Rate and Rhythm: Normal rate. Rhythm irregular.     Heart sounds: Normal heart sounds. No murmur heard. Pulmonary:     Effort: Pulmonary effort is normal. No respiratory distress.     Breath sounds: Normal breath sounds.  Abdominal:     General: Bowel sounds are  normal. There is no distension.     Palpations: Abdomen is soft. There is no mass.     Tenderness: There is no abdominal tenderness.  Musculoskeletal:     Cervical back: Neck supple.  Lymphadenopathy:     Cervical: No cervical adenopathy.  Skin:  General: Skin is warm and dry.  Neurological:     Mental Status: She is alert and oriented to person, place, and time.  Psychiatric:        Behavior: Behavior normal.    BP 104/66    Pulse 96    Temp 98 F (36.7 C)    Resp 16    Ht 5\' 4"  (1.626 m)    Wt 162 lb (73.5 kg)    SpO2 97%    BMI 27.81 kg/m  Wt Readings from Last 3 Encounters:  04/20/21 162 lb (73.5 kg)  01/19/21 163 lb 12.8 oz (74.3 kg)  04/12/20 160 lb 3.2 oz (72.7 kg)    Diabetic Foot Exam - Simple   No data filed    Lab Results  Component Value Date   WBC 6.8 01/19/2021   HGB 12.6 01/19/2021   HCT 39.0 01/19/2021   PLT 216.0 01/19/2021   GLUCOSE 102 (H) 01/19/2021   CHOL 90 01/19/2021   TRIG 203.0 (H) 01/19/2021   HDL 41.70 01/19/2021   LDLDIRECT 28.0 01/19/2021   LDLCALC 39 06/23/2019   ALT 12 01/19/2021   AST 21 01/19/2021   NA 140 01/19/2021   K 4.2 01/19/2021   CL 110 01/19/2021   CREATININE 1.25 (H) 01/19/2021   BUN 25 (H) 01/19/2021   CO2 20 01/19/2021   TSH 6.41 (H) 01/19/2021   HGBA1C 5.9 01/19/2021    Lab Results  Component Value Date   TSH 6.41 (H) 01/19/2021   Lab Results  Component Value Date   WBC 6.8 01/19/2021   HGB 12.6 01/19/2021   HCT 39.0 01/19/2021   MCV 98.5 01/19/2021   PLT 216.0 01/19/2021   Lab Results  Component Value Date   NA 140 01/19/2021   K 4.2 01/19/2021   CO2 20 01/19/2021   GLUCOSE 102 (H) 01/19/2021   BUN 25 (H) 01/19/2021   CREATININE 1.25 (H) 01/19/2021   BILITOT 0.5 01/19/2021   ALKPHOS 70 01/19/2021   AST 21 01/19/2021   ALT 12 01/19/2021   PROT 6.9 01/19/2021   ALBUMIN 4.5 01/19/2021   CALCIUM 9.7 01/19/2021   GFR 37.73 (L) 01/19/2021   Lab Results  Component Value Date   CHOL 90  01/19/2021   Lab Results  Component Value Date   HDL 41.70 01/19/2021   Lab Results  Component Value Date   LDLCALC 39 06/23/2019   Lab Results  Component Value Date   TRIG 203.0 (H) 01/19/2021   Lab Results  Component Value Date   CHOLHDL 2 01/19/2021   Lab Results  Component Value Date   HGBA1C 5.9 01/19/2021       Assessment & Plan:   Problem List Items Addressed This Visit     A-fib ([place])    Improved on recheck, continue       Hypertension - Primary    Well controlled, no changes to meds. Encouraged heart healthy diet such as the DASH diet and exercise as tolerated.       Relevant Orders   Comprehensive metabolic panel   CBC with Differential/Platelet   Lipid panel   TSH   High cholesterol    Encourage heart healthy diet such as MIND or DASH diet, increase exercise, avoid trans fats, simple carbohydrates and processed foods, consider a krill or fish or flaxseed oil cap daily.       Relevant Orders   Comprehensive metabolic panel   CBC with Differential/Platelet   Lipid panel   Diabetes  mellitus with diabetic nephropathy (HCC)    hgba1c acceptable, minimize simple carbs. Increase exercise as tolerated. Continue current meds. Had flu shot at her facility will request. Declines further covid shots, believes she had a tetanus shot about 4 years ago before she moved here. Advised to take one if she gets injured. Declines shingles shots      Relevant Orders   Hemoglobin A1c   Vitamin D deficiency    Supplement and monitor      Relevant Orders   VITAMIN D 25 Hydroxy (Vit-D Deficiency, Fractures)   Muscle cramps    Hydrate and monitor      Relevant Orders   Magnesium   Hypothyroid    On Levothyroxine, continue to monitor       I have discontinued Johanne Crotwell's levETIRAcetam. I am also having her maintain her potassium chloride SA, pravastatin, ondansetron, loperamide, Polyvinyl Alcohol-Povidone (REFRESH OP), Cholecalciferol, Biofreeze,  metoprolol succinate, furosemide, acetaminophen, levothyroxine, apixaban, losartan, azelastine, Cholestyramine, FLUoxetine, and famotidine.  No orders of the defined types were placed in this encounter.    Penni Homans, MD

## 2021-04-20 NOTE — Assessment & Plan Note (Signed)
Encourage heart healthy diet such as MIND or DASH diet, increase exercise, avoid trans fats, simple carbohydrates and processed foods, consider a krill or fish or flaxseed oil cap daily.  °

## 2021-04-20 NOTE — Patient Instructions (Signed)
Managing Your Hypertension Hypertension, also called high blood pressure, is when the force of the blood pressing against the walls of the arteries is too strong. Arteries are blood vessels that carry blood from your heart throughout your body. Hypertension forces the heart to work harder to pump blood and may cause the arteries to become narrow or stiff. Understanding blood pressure readings Your personal target blood pressure may vary depending on your medical conditions, your age, and other factors. A blood pressure reading includes a higher number over a lower number. Ideally, your blood pressure should be below 120/80. You should know that: The first, or top, number is called the systolic pressure. It is a measure of the pressure in your arteries as your heart beats. The second, or bottom number, is called the diastolic pressure. It is a measure of the pressure in your arteries as the heart relaxes. Blood pressure is classified into four stages. Based on your blood pressure reading, your health care provider may use the following stages to determine what type of treatment you need, if any. Systolic pressure and diastolic pressure are measured in a unit called mmHg. Normal Systolic pressure: below 120. Diastolic pressure: below 80. Elevated Systolic pressure: 120-129. Diastolic pressure: below 80. Hypertension stage 1 Systolic pressure: 130-139. Diastolic pressure: 80-89. Hypertension stage 2 Systolic pressure: 140 or above. Diastolic pressure: 90 or above. How can this condition affect me? Managing your hypertension is an important responsibility. Over time, hypertension can damage the arteries and decrease blood flow to important parts of the body, including the brain, heart, and kidneys. Having untreated or uncontrolled hypertension can lead to: A heart attack. A stroke. A weakened blood vessel (aneurysm). Heart failure. Kidney damage. Eye damage. Metabolic syndrome. Memory and  concentration problems. Vascular dementia. What actions can I take to manage this condition? Hypertension can be managed by making lifestyle changes and possibly by taking medicines. Your health care provider will help you make a plan to bring your blood pressure within a normal range. Nutrition  Eat a diet that is high in fiber and potassium, and low in salt (sodium), added sugar, and fat. An example eating plan is called the Dietary Approaches to Stop Hypertension (DASH) diet. To eat this way: Eat plenty of fresh fruits and vegetables. Try to fill one-half of your plate at each meal with fruits and vegetables. Eat whole grains, such as whole-wheat pasta, brown rice, or whole-grain bread. Fill about one-fourth of your plate with whole grains. Eat low-fat dairy products. Avoid fatty cuts of meat, processed or cured meats, and poultry with skin. Fill about one-fourth of your plate with lean proteins such as fish, chicken without skin, beans, eggs, and tofu. Avoid pre-made and processed foods. These tend to be higher in sodium, added sugar, and fat. Reduce your daily sodium intake. Most people with hypertension should eat less than 1,500 mg of sodium a day. Lifestyle  Work with your health care provider to maintain a healthy body weight or to lose weight. Ask what an ideal weight is for you. Get at least 30 minutes of exercise that causes your heart to beat faster (aerobic exercise) most days of the week. Activities may include walking, swimming, or biking. Include exercise to strengthen your muscles (resistance exercise), such as weight lifting, as part of your weekly exercise routine. Try to do these types of exercises for 30 minutes at least 3 days a week. Do not use any products that contain nicotine or tobacco, such as cigarettes, e-cigarettes,   and chewing tobacco. If you need help quitting, ask your health care provider. Control any long-term (chronic) conditions you have, such as high  cholesterol or diabetes. Identify your sources of stress and find ways to manage stress. This may include meditation, deep breathing, or making time for fun activities. Alcohol use Do not drink alcohol if: Your health care provider tells you not to drink. You are pregnant, may be pregnant, or are planning to become pregnant. If you drink alcohol: Limit how much you use to: 0-1 drink a day for women. 0-2 drinks a day for men. Be aware of how much alcohol is in your drink. In the [place]., one drink equals one 12 oz bottle of beer (355 mL), one 5 oz glass of wine (148 mL), or one 1 oz glass of hard liquor (44 mL). Medicines Your health care provider may prescribe medicine if lifestyle changes are not enough to get your blood pressure under control and if: Your systolic blood pressure is 130 or higher. Your diastolic blood pressure is 80 or higher. Take medicines only as told by your health care provider. Follow the directions carefully. Blood pressure medicines must be taken as told by your health care provider. The medicine does not work as well when you skip doses. Skipping doses also puts you at risk for problems. Monitoring Before you monitor your blood pressure: Do not smoke, drink caffeinated beverages, or exercise within 30 minutes before taking a measurement. Use the bathroom and empty your bladder (urinate). Sit quietly for at least 5 minutes before taking measurements. Monitor your blood pressure at home as told by your health care provider. To do this: Sit with your back straight and supported. Place your feet flat on the floor. Do not cross your legs. Support your arm on a flat surface, such as a table. Make sure your upper arm is at heart level. Each time you measure, take two or three readings one minute apart and record the results. You may also need to have your blood pressure checked regularly by your health care provider. General information Talk with your health care  provider about your diet, exercise habits, and other lifestyle factors that may be contributing to hypertension. Review all the medicines you take with your health care provider because there may be side effects or interactions. Keep all visits as told by your health care provider. Your health care provider can help you create and adjust your plan for managing your high blood pressure. Where to find more information National Heart, Lung, and Blood Institute: www.nhlbi.nih.gov American Heart Association: www.heart.org Contact a health care provider if: You think you are having a reaction to medicines you have taken. You have repeated (recurrent) headaches. You feel dizzy. You have swelling in your ankles. You have trouble with your vision. Get help right away if: You develop a severe headache or confusion. You have unusual weakness or numbness, or you feel faint. You have severe pain in your chest or abdomen. You vomit repeatedly. You have trouble breathing. These symptoms may represent a serious problem that is an emergency. Do not wait to see if the symptoms will go away. Get medical help right away. Call your local emergency services (911 in the U.S.). Do not drive yourself to the hospital. Summary Hypertension is when the force of blood pumping through your arteries is too strong. If this condition is not controlled, it may put you at risk for serious complications. Your personal target blood pressure may vary depending on   your medical conditions, your age, and other factors. For most people, a normal blood pressure is less than 120/80. Hypertension is managed by lifestyle changes, medicines, or both. Lifestyle changes to help manage hypertension include losing weight, eating a healthy, low-sodium diet, exercising more, stopping smoking, and limiting alcohol. This information is not intended to replace advice given to you by your health care provider. Make sure you discuss any questions  you have with your health care provider. Document Revised: 04/13/2019 Document Reviewed: 02/24/2019 Elsevier Patient Education  2022 Elsevier Inc.  

## 2021-04-20 NOTE — Assessment & Plan Note (Signed)
Well controlled, no changes to meds. Encouraged heart healthy diet such as the DASH diet and exercise as tolerated.  °

## 2021-04-24 DIAGNOSIS — H02105 Unspecified ectropion of left lower eyelid: Secondary | ICD-10-CM | POA: Diagnosis not present

## 2021-04-24 DIAGNOSIS — H18523 Epithelial (juvenile) corneal dystrophy, bilateral: Secondary | ICD-10-CM | POA: Diagnosis not present

## 2021-04-27 DIAGNOSIS — H903 Sensorineural hearing loss, bilateral: Secondary | ICD-10-CM | POA: Diagnosis not present

## 2021-05-05 ENCOUNTER — Encounter: Payer: Self-pay | Admitting: Family Medicine

## 2021-05-09 MED ORDER — FLUOXETINE HCL 10 MG PO CAPS: 10.0000 mg | ORAL_CAPSULE | Freq: Every day | ORAL | 2 refills | Status: DC

## 2021-05-16 ENCOUNTER — Telehealth: Payer: Self-pay | Admitting: Family Medicine

## 2021-05-16 NOTE — Telephone Encounter (Signed)
Virgilio Belling from [facility] is calling in regards of the patient's vit D medication. She states the instructions are not clear on the bottle and would like some clarification. She can be reached at [phone], and if she is not there to ask for [employer]. Please advise.

## 2021-05-17 NOTE — Telephone Encounter (Signed)
Please advise 

## 2021-05-18 NOTE — Telephone Encounter (Signed)
Spoke with [place] at [facility] , stated she would need a script sent in with the directions , stated on prescription pad paper is fine       Fax number for shaakira once done   [phone]

## 2021-05-18 NOTE — Telephone Encounter (Signed)
Called [facility] , front desk stated dee nor nadia was in currently will call back later

## 2021-05-19 NOTE — Telephone Encounter (Signed)
done

## 2021-08-02 ENCOUNTER — Encounter: Payer: Self-pay | Admitting: Cardiology

## 2021-08-02 ENCOUNTER — Ambulatory Visit (INDEPENDENT_AMBULATORY_CARE_PROVIDER_SITE_OTHER): Payer: PPO | Admitting: Cardiology

## 2021-08-02 DIAGNOSIS — I4821 Permanent atrial fibrillation: Secondary | ICD-10-CM | POA: Diagnosis not present

## 2021-08-02 DIAGNOSIS — I4811 Longstanding persistent atrial fibrillation: Secondary | ICD-10-CM

## 2021-08-02 DIAGNOSIS — I2729 Other secondary pulmonary hypertension: Secondary | ICD-10-CM | POA: Diagnosis not present

## 2021-08-02 DIAGNOSIS — I5022 Chronic systolic (congestive) heart failure: Secondary | ICD-10-CM | POA: Diagnosis not present

## 2021-08-02 DIAGNOSIS — I340 Nonrheumatic mitral (valve) insufficiency: Secondary | ICD-10-CM | POA: Diagnosis not present

## 2021-08-02 DIAGNOSIS — D6869 Other thrombophilia: Secondary | ICD-10-CM

## 2021-08-02 NOTE — Assessment & Plan Note (Signed)
Previously described as moderate.  Continue with conservative management.  Nonsurgical candidate. ?

## 2021-08-02 NOTE — Assessment & Plan Note (Signed)
Has not tolerated Entresto in the past.  Prior EF was 50 to 55% improved.  On beta-blocker and ARB.  Symptoms stable.  Doing well. ?

## 2021-08-02 NOTE — Progress Notes (Signed)
? ? ? ?CARDIOLOGY OFFICE NOTE ? ?Date:  08/03/2021  ? ? ?Erin Steele ?Date of Birth: 20-Dec-1927 ?Medical Record #[id number] ? ?PCP:  Mosie Lukes, MD  ?Cardiologist:  Marlou Porch  ? ? ? ?History of Present Illness: ?Erin Steele is a 88 y.o. female who presents today for follow-up atrial fibrillation.  ? ?She has a history of persistent AF, chronic anticoagulation with Eliquis, moderate MR, non occlusive CAD, chronic systolic HF, OSA, HTN, DM, carcinoid tumor of the small intestine and hypothyroidism. Has severe TR with moderate pulmonary HTN as well. Remote cath in 2017 with EF of 35 to 40%. Has not tolerated Entresto.  ? ?Resides at [place].  ?In July of 2020 - husband on Hospice and passed in January of 2021. Lots of stress. Some occasional chest discomfort that is chronic was noted.  ? ?Comes in today. Here with her daughter.  [place] - had broken arm and a brain bleed. Has seen neurosurgery here following CT scan - has been released - was off Eliquis about 3 weeks and now back on. But otherwise, back to her normal self. She is trying to be careful. Her chest pain is at baseline.  Still has trouble with equilibrium.  Neuropathy.  Uses a walker.  Tries to keep going.  Her daughter does take her to [facility] etc.  She does have some swelling in her feet especially right leg.  Feels fatigued.  She is trying to keep going however. ?Past Medical History:  ?Diagnosis Date  ? A-fib ([place])   ? Atrial fibrillation ([place])   ? Bilateral rotator cuff dysfunction   ? CAD (coronary artery disease)   ? CHF (congestive heart failure) ([place])   ? Diabetes mellitus with diabetic nephropathy ([place])   ? Diabetes mellitus without complication ([place])   ? High cholesterol   ? HIV infection ([place])   ? Hypercalcemia 03/25/2018  ? Hyperlipemia   ? Hypertension   ? Hypokalemia   ? Osteoarthritis   ? Vitamin D deficiency   ? ? ?Past Surgical History:  ?Procedure Laterality Date   ? ABDOMINAL HYSTERECTOMY    ? ANKLE SURGERY    ? and arms  ? APPENDECTOMY    ? chicken pox    ? intestinal surgery     ? for cancer  ? KIDNEY STONE SURGERY    ? measles    ? ? ? ?Medications: ?Current Meds  ?Medication Sig  ? acetaminophen (TYLENOL) 500 MG tablet Take 500 mg by mouth 3 (three) times daily as needed for mild pain, moderate pain, fever or headache.  ? apixaban (ELIQUIS) 5 MG TABS tablet Take 1 tablet (5 mg total) by mouth 2 (two) times daily.  ? azelastine (ASTELIN) 0.1 % nasal spray Place 2 sprays into both nostrils 2 (two) times daily. Use in each nostril as directed  ? BIOFREEZE 4 % GEL Apply topically as needed (shoulders).  ? Cholecalciferol 3000 units TABS Take by mouth 2 (two) times daily.  ? Cholestyramine POWD Use 1/2 scoop TID with meals  ? famotidine (PEPCID) 20 MG tablet Take 2 tablets (40 mg total) by mouth at bedtime.  ? FLUoxetine (PROZAC) 10 MG capsule Take 1 capsule (10 mg total) by mouth daily.  ? furosemide (LASIX) 20 MG tablet Take 20 mg by mouth daily.  ? levothyroxine (SYNTHROID, LEVOTHROID) 25 MCG tablet Take 25 mcg by mouth daily before breakfast.  ? loperamide (IMODIUM) 2 MG  capsule Take 2 mg by mouth as needed for diarrhea or loose stools.  ? losartan (COZAAR) 25 MG tablet Take 25 mg by mouth daily.  ? metoprolol succinate (TOPROL-XL) 25 MG 24 hr tablet Take 25 mg by mouth daily.  ? ondansetron (ZOFRAN) 4 MG tablet Take 4 mg by mouth every 8 (eight) hours as needed for nausea or vomiting.  ? Polyvinyl Alcohol-Povidone (REFRESH OP) Apply 1 drop to eye 4 (four) times daily.  ? potassium chloride SA (K-DUR) 20 MEQ tablet Take one tablet by mouth daily  ? pravastatin (PRAVACHOL) 20 MG tablet Take 20 mg by mouth daily.  ? ? ? ?Allergies: ?Allergies  ?Allergen Reactions  ? Morphine And Related Anxiety and Other (See Comments)  ?  Pt is unsure of allergy-02/17/18 ?Pt is unsure of allergy-02/17/18 ?Pt is unsure of allergy-02/17/18 ?  ? Penicillins Other (See Comments)  ?  Doesn't  remember reaction ?Doesn't remember reaction ?  ? ? ?Social History: ?The patient  reports that she has never smoked. She has never used smokeless tobacco. She reports current alcohol use. She reports that she does not use drugs. ?  ?Family History: ?The patient's family history includes COPD in her mother; Cancer in her sister; Heart disease in her daughter, daughter, maternal grandfather, maternal grandmother, and sister.  ? ?Review of Systems: ?Please see the history of present illness.   All other systems are reviewed and negative.  ? ?Physical Exam: ?VS:  BP 114/70 (BP Location: Right Arm, Patient Position: Sitting, Cuff Size: Normal)   Pulse 62   Ht '5\' 4"'$  (1.626 m)   Wt 154 lb (69.9 kg)   SpO2 95%   BMI 26.43 kg/m?  Marland Kitchen  BMI Body mass index is 26.43 kg/m?. ? ?Wt Readings from Last 3 Encounters:  ?08/02/21 154 lb (69.9 kg)  ?04/20/21 162 lb (73.5 kg)  ?01/19/21 163 lb 12.8 oz (74.3 kg)  ? ? ?General: Pleasant. Elderly. Alert and in no acute distress.   ?Cardiac: Irregular irregular rhythm. Rate is fine. Trace pedal edema.  ?Respiratory:  Lungs are clear to auscultation bilaterally with normal work of breathing.  ?GI: Soft and nontender.  ?MS: No deformity or atrophy. Gait and ROM intact.  ?Skin: Warm and dry. Color is normal.  ?Neuro:  Strength and sensation are intact and no gross focal deficits noted.  ?Psych: Alert, appropriate and with normal affect. ? ? ?LABORATORY DATA: ? ?EKG:  EKG is ordered today.  Atrial fibrillation 62 bpm no other abnormalities.  Personally reviewed by me. Prior demonstrates AF with controlled VR of 74 ? ?Lab Results  ?Component Value Date  ? WBC 7.6 04/20/2021  ? HGB 12.8 04/20/2021  ? HCT 39.5 04/20/2021  ? PLT 231.0 04/20/2021  ? GLUCOSE 114 (H) 04/20/2021  ? CHOL 94 04/20/2021  ? TRIG 85.0 04/20/2021  ? HDL 46.70 04/20/2021  ? LDLDIRECT 28.0 01/19/2021  ? [place] 31 04/20/2021  ? ALT 13 04/20/2021  ? AST 20 04/20/2021  ? NA 141 04/20/2021  ? K 4.4 04/20/2021  ? CL 109  04/20/2021  ? CREATININE 1.05 04/20/2021  ? BUN 25 (H) 04/20/2021  ? CO2 22 04/20/2021  ? TSH 4.56 04/20/2021  ? HGBA1C 6.0 04/20/2021  ? ? ? ?BNP (last 3 results) ?No results for input(s): BNP in the last 8760 hours. ? ?ProBNP (last 3 results) ?No results for input(s): PROBNP in the last 8760 hours. ? ? ?Other Studies Reviewed Today: ? ? ? ?ASSESSMENT & PLAN:   ?  ? ?  Permanent atrial fibrillation ([place]) ?CHA2DS2-VASc score of 6.  Rate is controlled.  On anticoagulation.  No bleeding.  Continue. ? ?Hypercoagulable state due to atrial fibrillation ([place]) ?Continue with Eliquis.  Doing well.  In 2021 she did suffer a fall was off of anticoagulation due to some dural hematoma.  Released from neurosurgery and she is back on therapy. ? ?Chronic systolic heart failure ([place]) ?Has not tolerated Entresto in the past.  Prior EF was 50 to 55% improved.  On beta-blocker and ARB.  Symptoms stable.  Doing well. ? ?Other secondary pulmonary hypertension ([place]) ?Continue with conservative management.  Treatment of underlying systolic heart failure. ? ?Mitral regurgitation ?Previously described as moderate.  Continue with conservative management.  Nonsurgical candidate.  ? ? ?Current medicines are reviewed with the patient today.  The patient does not have concerns regarding medicines other than what has been noted above. ? ?The following changes have been made:  See above. ? ?Labs/ tests ordered today include:  ? ? ?Orders Placed This Encounter  ?Procedures  ? EKG 12-Lead  ? ? ? ?Disposition:   1 yr ? ?Patient is agreeable to this plan and will call if any problems develop in the interim.  ? ?Signed: ?Candee Furbish, MD  ?08/03/2021 5:07 PM ? ?[facility] ?[address] ?[place], [place]  [address] ?Phone: [phone] ?Fax: [phone]  ?    ? ? ?

## 2021-08-02 NOTE — Patient Instructions (Signed)
Medication Instructions:  ?Your physician recommends that you continue on your current medications as directed. Please refer to the Current Medication list given to you today. ? ?*If you need a refill on your cardiac medications before your next appointment, please call your pharmacy* ? ? ?Lab Work: ?NONE ?If you have labs (blood work) drawn today and your tests are completely normal, you will receive your results only by: ?MyChart Message (if you have MyChart) OR ?A paper copy in the mail ?If you have any lab test that is abnormal or we need to change your treatment, we will call you to review the results. ? ? ?Testing/Procedures: ?NONE ? ? ?Follow-Up: ?At [facility], you and your health needs are our priority.  As part of our continuing mission to provide you with exceptional heart care, we have created designated Provider Care Teams.  These Care Teams include your primary Cardiologist (physician) and Advanced Practice Providers (APPs -  Physician Assistants and Nurse Practitioners) who all work together to provide you with the care you need, when you need it. ? ?Your next appointment:   ?6 month(s) ? ?The format for your next appointment:   ?In Person ? ?Provider:   ?Melina Copa, PA-C, Ermalinda Barrios, PA-C, or Richardson Dopp, PA-C      ? ? ?Important Information About Sugar ? ? ? ? ?  ?

## 2021-08-02 NOTE — Assessment & Plan Note (Signed)
CHA2DS2-VASc score of 6.  Rate is controlled.  On anticoagulation.  No bleeding.  Continue. ?

## 2021-08-02 NOTE — Assessment & Plan Note (Signed)
Continue with Eliquis.  Doing well.  In 2021 she did suffer a fall was off of anticoagulation due to some dural hematoma.  Released from neurosurgery and she is back on therapy. ?

## 2021-08-02 NOTE — Assessment & Plan Note (Signed)
Continue with conservative management.  Treatment of underlying systolic heart failure. ?

## 2021-08-22 ENCOUNTER — Ambulatory Visit: Payer: PPO | Admitting: Family Medicine

## 2021-09-11 ENCOUNTER — Encounter: Payer: Self-pay | Admitting: Family Medicine

## 2021-10-31 ENCOUNTER — Encounter: Payer: Self-pay | Admitting: Family Medicine

## 2021-12-25 DIAGNOSIS — H02105 Unspecified ectropion of left lower eyelid: Secondary | ICD-10-CM | POA: Diagnosis not present

## 2021-12-26 DIAGNOSIS — H02135 Senile ectropion of left lower eyelid: Secondary | ICD-10-CM | POA: Diagnosis not present

## 2021-12-26 DIAGNOSIS — H02132 Senile ectropion of right lower eyelid: Secondary | ICD-10-CM | POA: Diagnosis not present

## 2021-12-26 DIAGNOSIS — H04203 Unspecified epiphora, bilateral lacrimal glands: Secondary | ICD-10-CM | POA: Diagnosis not present

## 2022-01-26 ENCOUNTER — Telehealth: Payer: Self-pay | Admitting: *Deleted

## 2022-01-26 NOTE — Telephone Encounter (Signed)
   Pre-operative Risk Assessment    Patient Name: Erin Steele  DOB: Feb 17, 1928 MRN: [id number]      Request for Surgical Clearance    Procedure:   CONJUNCTIVOPLASTY RLL x 1 AND LLL x 2, ECTROPION REPAIR (TARSAL STRIP), AND PROBE NLD w/TUBE OR STENT-BOTH  Date of Surgery:  Clearance 02/14/22                                 Surgeon:  DR. Hollice Espy Surgeon's Group or Practice Name:  [facility]  Phone number:  [phone] Fax number:  [phone]   Type of Clearance Requested:   - Medical  - Pharmacy:  Hold Apixaban (Eliquis) x 3 DAYS PRIOR TO SURGERY AND 2 DAYS POST SURGERY (RESUME ON DAY 3 POST-OP) TOTAL OFF = 5 DAYS    Type of Anesthesia:   TOPICAL ANESTHESIA WITH IV PROCEDURAL SEDATION   Additional requests/questions:    Jiles Prows   01/26/2022, 1:59 PM

## 2022-01-29 NOTE — Telephone Encounter (Signed)
Patient with diagnosis of a Fib on Eliquis for anticoagulation.    Procedure: CONJUNCTIVOPLASTY RLL x 1 AND LLL x 2, ECTROPION REPAIR (TARSAL STRIP), AND PROBE NLD w/TUBE OR STENT-BOTH Date of procedure: 02/14/22   CHA2DS2-VASc Score = 7  This indicates a 11.2% annual risk of stroke. The patient's score is based upon: CHF History: 1 HTN History: 1 Diabetes History: 1 Stroke History: 0 Vascular Disease History: 1 Age Score: 2 Gender Score: 1    CrCl 38 mL/min Platelet count 231K  Patient will be high risk off anticoagulation. Surgeon requesting 5 day Eliquis hold. Will route to Dr Marlou Porch for input.  **This guidance is not considered finalized until pre-operative APP has relayed final recommendations.**

## 2022-01-30 NOTE — Telephone Encounter (Signed)
Needs virtual visit for clearance and to discuss holding Eliquis, please. TY!  Loel Dubonnet, NP

## 2022-01-30 NOTE — Telephone Encounter (Signed)
Spoke with the patients daughter who stated "right now is not a good time and I have a client in my office", I requested she contact the office to discuss next step[s for pre-op clearance. She states she will contact the office when she is available.

## 2022-01-31 ENCOUNTER — Telehealth: Payer: Self-pay | Admitting: *Deleted

## 2022-01-31 NOTE — Telephone Encounter (Signed)
I did not need this encounter. °

## 2022-01-31 NOTE — Telephone Encounter (Signed)
I s/w the pt's daughter Willette Cluster, [place]. Pt has been scheduled for a tele pre op appt 02/02/22 @ 2 pm. Pt resides in [facility]. I will try to reach out to the SNF to s/w nurse in hopes to confirm medications. Consent has been given.   I s/w Desiree at [facility] [phone]. She will fax over the med list so that we may confirm medications are correct in the chart before tele pre op appt 02/02/22

## 2022-01-31 NOTE — Telephone Encounter (Signed)
I s/w the pt's daughter Erin Steele, [place]. Pt has been scheduled for a tele pre op appt 02/02/22 @ 2 pm. Pt resides in [facility]. I will try to reach out to the SNF to s/w nurse in hopes to confirm medications. Consent has been given.    I s/w Desiree at [facility] [phone]. She will fax over the med list so that we may confirm medications are correct in the chart before tele pre op appt 02/02/22   Med rec and consent are done.     Patient Consent for Virtual Visit        Erin Steele has provided verbal consent on 01/31/2022 for a virtual visit (video or telephone).   CONSENT FOR VIRTUAL VISIT FOR:  Erin Steele  By participating in this virtual visit I agree to the following:  I hereby voluntarily request, consent and authorize [facility] and its employed or contracted physicians, physician assistants, nurse practitioners or other licensed health care professionals (the Practitioner), to provide me with telemedicine health care services (the "Services") as deemed necessary by the treating Practitioner. I acknowledge and consent to receive the Services by the Practitioner via telemedicine. I understand that the telemedicine visit will involve communicating with the Practitioner through live audiovisual communication technology and the disclosure of certain medical information by electronic transmission. I acknowledge that I have been given the opportunity to request an in-person assessment or other available alternative prior to the telemedicine visit and am voluntarily participating in the telemedicine visit.  I understand that I have the right to withhold or withdraw my consent to the use of telemedicine in the course of my care at any time, without affecting my right to future care or treatment, and that the Practitioner or I may terminate the telemedicine visit at any time. I understand that I have the right to inspect all information obtained and/or recorded in the  course of the telemedicine visit and may receive copies of available information for a reasonable fee.  I understand that some of the potential risks of receiving the Services via telemedicine include:  Delay or interruption in medical evaluation due to technological equipment failure or disruption; Information transmitted may not be sufficient (e.g. poor resolution of images) to allow for appropriate medical decision making by the Practitioner; and/or  In rare instances, security protocols could fail, causing a breach of personal health information.  Furthermore, I acknowledge that it is my responsibility to provide information about my medical history, conditions and care that is complete and accurate to the best of my ability. I acknowledge that Practitioner's advice, recommendations, and/or decision may be based on factors not within their control, such as incomplete or inaccurate data provided by me or distortions of diagnostic images or specimens that may result from electronic transmissions. I understand that the practice of medicine is not an exact science and that Practitioner makes no warranties or guarantees regarding treatment outcomes. I acknowledge that a copy of this consent can be made available to me via my patient portal ([place]), or I can request a printed copy by calling the office of [facility].    I understand that my insurance will be billed for this visit.   I have read or had this consent read to me. I understand the contents of this consent, which adequately explains the benefits and risks of the Services being provided via telemedicine.  I have been provided ample opportunity to ask questions regarding this consent and the Services  and have had my questions answered to my satisfaction. I give my informed consent for the services to be provided through the use of telemedicine in my medical care

## 2022-02-02 ENCOUNTER — Encounter (HOSPITAL_BASED_OUTPATIENT_CLINIC_OR_DEPARTMENT_OTHER): Payer: Self-pay | Admitting: Family

## 2022-02-02 ENCOUNTER — Ambulatory Visit (INDEPENDENT_AMBULATORY_CARE_PROVIDER_SITE_OTHER): Payer: PPO | Admitting: Family

## 2022-02-02 DIAGNOSIS — Z01818 Encounter for other preprocedural examination: Secondary | ICD-10-CM

## 2022-02-02 NOTE — Patient Instructions (Addendum)
Medication Instructions:  Your physician has recommended you make the following change in your medication:   HOLD Eliquis 3 days prior and 2 days after procedure.  We have called these instructions to your living facility.   *If you need a refill on your cardiac medications before your next appointment, please call your pharmacy*   Lab Work/Testing/Procedures: None ordered today.  Follow-Up: At [facility], you and your health needs are our priority.  As part of our continuing mission to provide you with exceptional heart care, we have created designated Provider Care Teams.  These Care Teams include your primary Cardiologist (physician) and Advanced Practice Providers (APPs -  Physician Assistants and Nurse Practitioners) who all work together to provide you with the care you need, when you need it.  We recommend signing up for the patient portal called "MyChart".  Sign up information is provided on this After Visit Summary.  MyChart is used to connect with patients for Virtual Visits (Telemedicine).  Patients are able to view lab/test results, encounter notes, upcoming appointments, etc.  Non-urgent messages can be sent to your provider as well.   To learn more about what you can do with MyChart, go to [email or web address].    Your next appointment:   Please call to schedule follow up with Dr. Marlou Porch  [phone]   Other Instructions  Heart Healthy Diet Recommendations: A low-salt diet is recommended. Meats should be grilled, baked, or boiled. Avoid fried foods. Focus on lean protein sources like fish or chicken with vegetables and fruits. The American Heart Association is a Microbiologist!  American Heart Association Diet and Lifeystyle Recommendations   Exercise recommendations: The American Heart Association recommends 150 minutes of moderate intensity exercise weekly. Try 30 minutes of moderate intensity exercise 4-5 times per week. This could include walking,  jogging, or swimming.   Important Information About Sugar

## 2022-02-02 NOTE — Progress Notes (Signed)
Virtual Visit via Telephone Note   Because of Erin Steele co-morbid illnesses, she is at least at moderate risk for complications without adequate follow up.  This format is felt to be most appropriate for this patient at this time.  The patient did not have access to video technology/had technical difficulties with video requiring transitioning to audio format only (telephone).  All issues noted in this document were discussed and addressed.  No physical exam could be performed with this format.  Please refer to the patient's chart for her consent to telehealth for [facility]    Date:  02/02/2022   ID:  Erin Steele, DOB 11/27/1927, MRN [id number] The patient was identified using 2 identifiers.  Patient Location: Home Provider Location: Home Office  PCP:  Mosie Lukes, MD   [facility] Cardiologist:  Candee Furbish, MD     Evaluation Performed:  Follow-Up Visit  Chief Complaint:  Preop clearance  History of Present Illness:    Erin Steele is a 88 y.o. female with atrial fibrillation on chronic anticoagulation, moderate MR, nonocclusive CAD, HFrEF, OSA, HTN, DM2, carcinoid tumor of small intestine, hypothyroidism, pulmonary hypertension, TR. Last seen 08/02/21 by Dr. Marlou Porch. She was doing well from cardiac perspective and recommended to follow up in 6 months.    Presents today for preoperative clearance for conjunctivoplasty, ectropion repair, and probe nld w/ tube or stent. Per pharmacy team, Dr. Marlou Porch, and office protocol may hold Eliquis 3 days prior to surgery and 2 days post procedure.  Reports no shortness of breath nor dyspnea on exertion.  No edema, orthopnea, PND. Reports no palpitations.  Still little bit of chest tightness if she is too tired or pushes too hard that self resolves after a few moments. Tells me it is about the same as when she last Dr. Marlou Porch and unchanged over the years. She stays busy in her garden,  walking for exercise.   Past Medical History:  Diagnosis Date   A-fib [facility])    Atrial fibrillation (HCC)    Bilateral rotator cuff dysfunction    CAD (coronary artery disease)    CHF (congestive heart failure) (HCC)    Diabetes mellitus with diabetic nephropathy (HCC)    Diabetes mellitus without complication (HCC)    High cholesterol    HIV infection ([place])    Hypercalcemia 03/25/2018   Hyperlipemia    Hypertension    Hypokalemia    Osteoarthritis    Vitamin D deficiency    Past Surgical History:  Procedure Laterality Date   ABDOMINAL HYSTERECTOMY     ANKLE SURGERY     and arms   APPENDECTOMY     chicken pox     intestinal surgery      for cancer   KIDNEY STONE SURGERY     measles       Current Meds  Medication Sig   acetaminophen (TYLENOL) 500 MG tablet Take 500 mg by mouth 3 (three) times daily as needed for mild pain, moderate pain, fever or headache.   apixaban (ELIQUIS) 5 MG TABS tablet Take 1 tablet (5 mg total) by mouth 2 (two) times daily.   azelastine (ASTELIN) 0.1 % nasal spray Place 2 sprays into both nostrils 2 (two) times daily. Use in each nostril as directed   BIOFREEZE 4 % GEL Apply topically as needed (shoulders).   Cholecalciferol 25 MCG (1000 UT) tablet Take 3 tablets by mouth 2 (two) times daily.   Cholestyramine POWD Use 1/2 scoop  TID with meals   erythromycin ophthalmic ointment Place 1 Application into the left eye daily.   famotidine (PEPCID) 20 MG tablet Take 2 tablets (40 mg total) by mouth at bedtime.   FLUoxetine (PROZAC) 10 MG capsule Take 1 capsule (10 mg total) by mouth daily.   furosemide (LASIX) 20 MG tablet Take 20 mg by mouth daily.   levothyroxine (SYNTHROID, LEVOTHROID) 25 MCG tablet Take 25 mcg by mouth daily before breakfast.   loperamide (IMODIUM) 2 MG capsule Take 2 mg by mouth as needed for diarrhea or loose stools.   losartan (COZAAR) 25 MG tablet Take 25 mg by mouth daily.   metoprolol succinate (TOPROL-XL) 25 MG 24 hr tablet  Take 25 mg by mouth daily.   ondansetron (ZOFRAN) 4 MG tablet Take 4 mg by mouth every 8 (eight) hours as needed for nausea or vomiting.   Polyvinyl Alcohol-Povidone (REFRESH OP) Apply 1 drop to eye 4 (four) times daily.   potassium chloride SA (K-DUR) 20 MEQ tablet Take one tablet by mouth daily   pravastatin (PRAVACHOL) 20 MG tablet Take 20 mg by mouth daily.     Allergies:   Morphine and related and Penicillins   Social History   Tobacco Use   Smoking status: Never   Smokeless tobacco: Never  Substance Use Topics   Alcohol use: Yes    Comment: occasionally, mexican milkshake at Christmas   Drug use: No     Family Hx: The patient's family history includes COPD in her mother; Cancer in her sister; Heart disease in her daughter, daughter, maternal grandfather, maternal grandmother, and sister.  ROS:   Please see the history of present illness.     All other systems reviewed and are negative.   Prior CV studies:   The following studies were reviewed today:  None  Labs/Other Tests and Data Reviewed:    EKG:  No ECG reviewed.  Recent Labs: 04/20/2021: ALT 13; BUN 25; Creatinine, Ser 1.05; Hemoglobin 12.8; Magnesium 1.7; Platelets 231.0; Potassium 4.4; Sodium 141; TSH 4.56   Recent Lipid Panel Lab Results  Component Value Date/Time   CHOL 94 04/20/2021 09:23 AM   CHOL 95 (L) 11/05/2018 10:49 AM   TRIG 85.0 04/20/2021 09:23 AM   HDL 46.70 04/20/2021 09:23 AM   HDL 44 11/05/2018 10:49 AM   CHOLHDL 2 04/20/2021 09:23 AM   LDLCALC 31 04/20/2021 09:23 AM   LDLCALC 36 11/05/2018 10:49 AM   LDLDIRECT 28.0 01/19/2021 04:16 PM    Wt Readings from Last 3 Encounters:  08/02/21 154 lb (69.9 kg)  04/20/21 162 lb (73.5 kg)  01/19/21 163 lb 12.8 oz (74.3 kg)     Risk Assessment/Calculations:    CHA2DS2-VASc Score = 7   This indicates a 11.2% annual risk of stroke. The patient's score is based upon: CHF History: 1 HTN History: 1 Diabetes History: 1 Stroke History:  0 Vascular Disease History: 1 Age Score: 2 Gender Score: 1         Objective:    Vital Signs:  No vital signs available for review.   ASSESSMENT & PLAN:    Preoperative clearance - Able to achieve >4 METS. Per AHA/ACC guidelines, she is deemed acceptable risk for the planned procedure without additional cardiovascular testing. Will route to surgical team so they are aware.    Anticoagulant:Per Dr. Marlou Porch and pharmacy team, may hold Eliquis 3 days pre and 2 days post procedure.   Spoke with York Cerise, RN at [facility] (phone [phone]) and relayed  instructions to HOLD Eliquis 02/11/22 through 02/16/22. May resume 02/17/22. Also faxed to [phone].  Atrial fibrillation - Permanent rate controlled atrial fibrillation. CHA2DS2-VASc Score = 7 [CHF History: 1, HTN History: 1, Diabetes History: 1, Stroke History: 0, Vascular Disease History: 1, Age Score: 2, Gender Score: 1].  Therefore, the patient's annual risk of stroke is 11.2 %.    Eliquis, as above.   Time:   Today, I have spent 5 minutes with the patient with telehealth technology discussing the above problems.    Medication Adjustments/Labs and Tests Ordered: Current medicines are reviewed at length with the patient today.  Concerns regarding medicines are outlined above.   Tests Ordered: No orders of the defined types were placed in this encounter.  Medication Changes: No orders of the defined types were placed in this encounter.  Follow Up:  In Person  with Dr. Marlou Porch prefers to call back to schedule appt  Signed, Loel Dubonnet, NP  02/02/2022 2:18 PM    [place]

## 2022-02-05 ENCOUNTER — Telehealth: Payer: Self-pay | Admitting: Cardiology

## 2022-02-05 NOTE — Telephone Encounter (Signed)
   Patient Name: Erin Steele  DOB: 10-18-1927 MRN: [id number]  Primary Cardiologist: Candee Furbish, MD  Chart reviewed as part of pre-operative protocol coverage. Pre-op clearance already addressed by colleagues in earlier phone notes. To summarize recommendations:  -Preoperative clearance - Able to achieve >4 METS. Per AHA/ACC guidelines, she is deemed acceptable risk for the planned procedure without additional cardiovascular testing. Will route to surgical team so they are aware.     Anticoagulant:Per Dr. Marlou Porch and pharmacy team, may hold Eliquis 3 days pre and 2 days post procedure.   Spoke with York Cerise, RN at [facility] (phone [phone]) and relayed instructions to HOLD Eliquis 02/11/22 through 02/16/22. May resume 02/17/22. Also faxed to [phone].  Will route this bundled recommendation to requesting provider via Epic fax function and remove from pre-op pool. Please call with questions.  Elgie Collard, PA-C 02/05/2022, 2:31 PM

## 2022-02-05 NOTE — Telephone Encounter (Signed)
I will forward to pre op provider for review if the pt has been cleared.   Pt's daughter is calling.

## 2022-02-05 NOTE — Telephone Encounter (Signed)
Office calling to F/U on Clearance for pt. Please advise

## 2022-02-05 NOTE — Telephone Encounter (Signed)
Caller is following-up on patient's cardiac clearance.

## 2022-02-05 NOTE — Telephone Encounter (Signed)
I called the requesting office back again and have left message that the pt has been cleared and see recommendations from Dr. Marlou Porch for Eliquis.

## 2022-02-07 ENCOUNTER — Ambulatory Visit: Payer: PPO | Admitting: Physician Assistant

## 2022-02-14 DIAGNOSIS — H02105 Unspecified ectropion of left lower eyelid: Secondary | ICD-10-CM | POA: Diagnosis not present

## 2022-02-14 DIAGNOSIS — H02135 Senile ectropion of left lower eyelid: Secondary | ICD-10-CM | POA: Diagnosis not present

## 2022-02-14 DIAGNOSIS — H04553 Acquired stenosis of bilateral nasolacrimal duct: Secondary | ICD-10-CM | POA: Diagnosis not present

## 2022-02-14 DIAGNOSIS — H02102 Unspecified ectropion of right lower eyelid: Secondary | ICD-10-CM | POA: Diagnosis not present

## 2022-02-14 DIAGNOSIS — H02132 Senile ectropion of right lower eyelid: Secondary | ICD-10-CM | POA: Diagnosis not present

## 2022-04-27 ENCOUNTER — Telehealth: Payer: Self-pay | Admitting: Family Medicine

## 2022-04-27 ENCOUNTER — Other Ambulatory Visit: Payer: Self-pay

## 2022-04-27 NOTE — Telephone Encounter (Signed)
Erin Steele with [facility] calling to advise that the patient stated two of her medications are discontinued but they do not have an order for those.   Famotidine and Fluxoetine. Advised the fluoxetine was discontinued in 2022 but refilled in January of 2023.The patient said she gets something for anxiety that gives her bad dreams but she cannot remember it but she still receives it. Beckie Busing is not aware of any medication for anxiety.   Please fax discontinue orders to [phone]. Phone number [phone]

## 2022-04-27 NOTE — Telephone Encounter (Signed)
Called Erin Steele with [facility] spoke with her regarding Medication, Monique Stated pt was thinking medication was discontinued. Beckie Busing  was advised on 10/22 visit dozes changed because pt Was having bad dreams. Garment/textile technologist from [facility] stated understand and will share will pt.

## 2022-04-30 ENCOUNTER — Encounter: Payer: Self-pay | Admitting: Family Medicine

## 2022-05-03 ENCOUNTER — Telehealth: Payer: Self-pay

## 2022-05-03 NOTE — Telephone Encounter (Signed)
Called pt daughter lvm regarding stopping the medication  Dr.Blyth agree and ask her to call our office back if we need to give a verbal order to mother facility.

## 2022-05-03 NOTE — Telephone Encounter (Signed)
Erin Steele and spoke with nurse  And was given verbal order to stop fluoxitine 10 mg per Dr.Blyth.

## 2022-05-08 DIAGNOSIS — H02005 Unspecified entropion of left lower eyelid: Secondary | ICD-10-CM | POA: Diagnosis not present

## 2022-05-29 ENCOUNTER — Encounter: Payer: Self-pay | Admitting: Family Medicine

## 2022-05-31 ENCOUNTER — Other Ambulatory Visit: Payer: Self-pay

## 2022-05-31 MED ORDER — FUROSEMIDE 20 MG PO TABS: 20.0000 mg | ORAL_TABLET | Freq: Every day | ORAL | 2 refills | Status: DC

## 2022-05-31 MED ORDER — METOPROLOL SUCCINATE ER 25 MG PO TB24: 25.0000 mg | ORAL_TABLET | Freq: Every day | ORAL | 2 refills | Status: DC

## 2022-05-31 MED ORDER — AZELASTINE HCL 0.1 % NA SOLN: 2.0000 | Freq: Two times a day (BID) | NASAL | 12 refills | Status: AC

## 2022-05-31 MED ORDER — POTASSIUM CHLORIDE CRYS ER 20 MEQ PO TBCR: EXTENDED_RELEASE_TABLET | ORAL | 2 refills | Status: DC

## 2022-05-31 MED ORDER — PRAVASTATIN SODIUM 20 MG PO TABS: 20.0000 mg | ORAL_TABLET | Freq: Every day | ORAL | 2 refills | Status: DC

## 2022-05-31 MED ORDER — APIXABAN 5 MG PO TABS: 5.0000 mg | ORAL_TABLET | Freq: Two times a day (BID) | ORAL | 0 refills | Status: DC

## 2022-05-31 MED ORDER — CHOLESTYRAMINE POWD: 5 refills | Status: DC

## 2022-05-31 MED ORDER — LOSARTAN POTASSIUM 25 MG PO TABS: 25.0000 mg | ORAL_TABLET | Freq: Every day | ORAL | 2 refills | Status: DC

## 2022-05-31 MED ORDER — POTASSIUM CHLORIDE CRYS ER 20 MEQ PO TBCR: EXTENDED_RELEASE_TABLET | ORAL | 12 refills | Status: DC

## 2022-05-31 NOTE — Telephone Encounter (Signed)
Called pt daughter spoke with her regarding medication  Ask her for [facility] number per Dr.Blyth to go over current meds. Pt daughter stated Assisted living never answer phone but Pt daughter said Takes the same medications.Called [facility] not able to lvm or spoke to someone.

## 2022-05-31 NOTE — Telephone Encounter (Signed)
Called pt daughter spoke regarding medication  review medication list, medication sent to pharmacy.

## 2022-06-04 ENCOUNTER — Other Ambulatory Visit: Payer: Self-pay

## 2022-06-04 MED ORDER — LEVOTHYROXINE SODIUM 25 MCG PO TABS: 25.0000 ug | ORAL_TABLET | Freq: Every day | ORAL | 2 refills | Status: DC

## 2022-06-06 ENCOUNTER — Other Ambulatory Visit: Payer: Self-pay

## 2022-06-06 MED ORDER — APIXABAN 5 MG PO TABS: 5.0000 mg | ORAL_TABLET | Freq: Two times a day (BID) | ORAL | 1 refills | Status: DC

## 2022-07-11 ENCOUNTER — Telehealth: Payer: Self-pay | Admitting: Family Medicine

## 2022-07-11 NOTE — Telephone Encounter (Signed)
Erin Steele with [facility] called to provide FYI for Dr. Charlett Blake. [place] but had no injury.

## 2022-07-26 ENCOUNTER — Encounter: Payer: Self-pay | Admitting: Family Medicine

## 2022-07-26 ENCOUNTER — Other Ambulatory Visit: Payer: Self-pay | Admitting: Family Medicine

## 2022-09-06 ENCOUNTER — Other Ambulatory Visit: Payer: Self-pay | Admitting: Family Medicine

## 2022-09-06 ENCOUNTER — Encounter: Payer: Self-pay | Admitting: Family Medicine

## 2022-09-06 ENCOUNTER — Other Ambulatory Visit: Payer: Self-pay

## 2022-09-06 MED ORDER — FLUOXETINE HCL 10 MG PO CAPS: 10.0000 mg | ORAL_CAPSULE | Freq: Every day | ORAL | 1 refills | Status: DC

## 2022-09-17 ENCOUNTER — Encounter: Payer: Self-pay | Admitting: Family Medicine

## 2022-09-17 ENCOUNTER — Other Ambulatory Visit: Payer: Self-pay

## 2022-09-17 MED ORDER — APIXABAN 5 MG PO TABS: 5.0000 mg | ORAL_TABLET | Freq: Two times a day (BID) | ORAL | 3 refills | Status: DC

## 2022-10-22 ENCOUNTER — Telehealth: Payer: Self-pay

## 2022-10-22 NOTE — Telephone Encounter (Signed)
Received a call on the Nurse line from Triage nurse [place]. The pts daughter Haywood Lasso called asking for an appointment with Dr Abner Greenspan. She says it was not urgent and that the pt had been having pain in her head, arm, and, chest x 1 week . She says she has had this occur before.   I went ahead and scheduled her but wanted to make you both aware. She was actually wanting the appointment for this Wednesday or Thursday. I scheduled for Tuesday.   Pt has a hx of subdural hematoma, a fib and other Dx that are concerning (and is on Eliquis) should she wait to be seen? Daughter does not seem too concerned.

## 2022-10-23 ENCOUNTER — Telehealth (HOSPITAL_BASED_OUTPATIENT_CLINIC_OR_DEPARTMENT_OTHER): Payer: Self-pay

## 2022-10-23 ENCOUNTER — Ambulatory Visit (INDEPENDENT_AMBULATORY_CARE_PROVIDER_SITE_OTHER): Payer: PPO | Admitting: Family Medicine

## 2022-10-23 ENCOUNTER — Encounter: Payer: Self-pay | Admitting: Family Medicine

## 2022-10-23 VITALS — BP 119/50 | HR 74 | Ht 64.0 in | Wt 165.0 lb

## 2022-10-23 DIAGNOSIS — R519 Headache, unspecified: Secondary | ICD-10-CM | POA: Diagnosis not present

## 2022-10-23 DIAGNOSIS — R42 Dizziness and giddiness: Secondary | ICD-10-CM | POA: Diagnosis not present

## 2022-10-23 DIAGNOSIS — H903 Sensorineural hearing loss, bilateral: Secondary | ICD-10-CM | POA: Diagnosis not present

## 2022-10-23 LAB — COMPREHENSIVE METABOLIC PANEL
ALT: 13 U/L (ref 0–35)
AST: 20 U/L (ref 0–37)
Albumin: 4.4 g/dL (ref 3.5–5.2)
Alkaline Phosphatase: 58 U/L (ref 39–117)
BUN: 20 mg/dL (ref 6–23)
CO2: 22 mEq/L (ref 19–32)
Calcium: 9.8 mg/dL (ref 8.4–10.5)
Chloride: 112 mEq/L (ref 96–112)
Creatinine, Ser: 1.09 mg/dL (ref 0.40–1.20)
GFR: 43.93 mL/min — ABNORMAL LOW (ref >60.00)
Glucose, Bld: 102 mg/dL — ABNORMAL HIGH (ref 70–99)
Potassium: 4.5 mEq/L (ref 3.5–5.1)
Sodium: 140 mEq/L (ref 135–145)
Total Bilirubin: 1 mg/dL (ref 0.2–1.2)
Total Protein: 6.6 g/dL (ref 6.0–8.3)

## 2022-10-23 LAB — CBC WITH DIFFERENTIAL/PLATELET
Basophils Absolute: 0 10*3/uL (ref 0.0–0.1)
Basophils Relative: 0.3 % (ref 0.0–3.0)
Eosinophils Absolute: 0.1 10*3/uL (ref 0.0–0.7)
Eosinophils Relative: 1 % (ref 0.0–5.0)
HCT: 37.4 % (ref 36.0–46.0)
Hemoglobin: 12.2 g/dL (ref 12.0–15.0)
Lymphocytes Relative: 27 % (ref 12.0–46.0)
Lymphs Abs: 2.1 10*3/uL (ref 0.7–4.0)
MCHC: 32.5 g/dL (ref 30.0–36.0)
MCV: 98.7 fl (ref 78.0–100.0)
Monocytes Absolute: 0.8 10*3/uL (ref 0.1–1.0)
Monocytes Relative: 9.8 % (ref 3.0–12.0)
Neutro Abs: 4.9 10*3/uL (ref 1.4–7.7)
Neutrophils Relative %: 61.9 % (ref 43.0–77.0)
Platelets: 219 10*3/uL (ref 150.0–400.0)
RBC: 3.79 Mil/uL — ABNORMAL LOW (ref 3.87–5.11)
RDW: 13.6 % (ref 11.5–15.5)
WBC: 7.9 10*3/uL (ref 4.0–10.5)

## 2022-10-23 NOTE — Progress Notes (Signed)
Acute Office Visit  Subjective:     Patient ID: Carlee Vonderhaar, female    DOB: 26-Nov-1927, 89 y.o.   MRN: [id number]  Chief Complaint  Patient presents with   Pain    HPI Patient is in today for headaches.  Discussed the use of AI scribe software for clinical note transcription with the patient, who gave verbal consent to proceed.  History of Present Illness   The patient presents with a new onset of headaches and head pressure for a little over a month. The headaches, which are not daily but have occurred about five to six times, are described as pressure on the right side and significant pain on the left side of the head. The pain radiates from the back of the head, behind the ear, and wraps around the head and then down her body. The intensity of the headaches is rated as an eight out of ten, lasting for about half a minute to a minute. The patient reports feeling lightheaded during these episodes but denies any changes in vision.  The patient also reports a single episode of disorientation and weakness on the left side, which resolved in a few hours. She told her daughter she thought she had a mini-stroke. No new numbness/tingling, but she does have history of peripheral neuropathy.   The patient is on blood thinners and maintains good hydration, primarily drinking water throughout the day. She is independent and performs all activities of daily living herself. She does have a history of subdural hematoma after a fall several years ago.            ROS All review of systems negative except what is listed in the HPI      Objective:    BP (!) 119/50   Pulse 74   Ht 5\' 4"  (1.626 m)   Wt 165 lb (74.8 kg)   SpO2 100%   BMI 28.32 kg/m    Physical Exam Vitals reviewed.  Constitutional:      Appearance: Normal appearance.  Cardiovascular:     Rate and Rhythm: Normal rate and regular rhythm.  Pulmonary:     Effort: Pulmonary effort is normal.     Breath sounds:  Normal breath sounds.  Musculoskeletal:     Cervical back: Normal range of motion and neck supple.  Skin:    General: Skin is warm and dry.  Neurological:     General: No focal deficit present.     Mental Status: She is alert and oriented to person, place, and time. Mental status is at baseline.     Cranial Nerves: No cranial nerve deficit.     Motor: No weakness.     Coordination: Coordination normal.     Gait: Gait normal.     Comments: NIHSS 0  Psychiatric:        Mood and Affect: Mood normal.        Behavior: Behavior normal.        Thought Content: Thought content normal.        Judgment: Judgment normal.        No results found for any visits on 10/23/22.      Assessment & Plan:   Problem List Items Addressed This Visit   None Visit Diagnoses     New onset of headaches    -  Primary   Relevant Orders   CT HEAD WO CONTRAST ( )   CBC with Differential/Platelet   Comprehensive metabolic panel  New Onset Headaches: Described as pressure on the right side and pain on the left side of the head, radiating to the neck and lasting for about a minute. Occurring approximately 5-6 times over the past month. No associated vision changes. Mild lightheadedness reported. No history of similar headaches. -Discussed with PCP, Dr. Abner Greenspan. Will go ahead and repeat basic labs and CT head. -Patient aware of symptoms to monitor for.     No orders of the defined types were placed in this encounter.   Return if symptoms worsen or fail to improve.  Clayborne Dana, NP

## 2022-10-23 NOTE — Patient Instructions (Signed)
Let's get a repeat CT scan to compare to the last one given the new headaches and symptoms off possible min-stroke about a month ago. Updating basic labs today also. We will follow-up based on results. Make sure you are staying well hydrated, eating regular meals, and getting adequate sleep.   Please contact office for follow-up if symptoms do not improve or worsen. Seek emergency care if symptoms become severe.

## 2022-10-31 ENCOUNTER — Telehealth: Payer: Self-pay

## 2022-10-31 NOTE — Telephone Encounter (Signed)
Reached out to patient to set up follow up visit with provider to discuss chronic conditions.  Telephone encounter attempt : 1   No answer.  Elijio [facility] Specialist

## 2022-11-24 ENCOUNTER — Other Ambulatory Visit: Payer: Self-pay | Admitting: Family Medicine

## 2022-12-22 ENCOUNTER — Other Ambulatory Visit: Payer: Self-pay | Admitting: Family Medicine

## 2023-01-31 ENCOUNTER — Telehealth: Payer: Self-pay | Admitting: Family Medicine

## 2023-01-31 NOTE — Telephone Encounter (Signed)
[facility] called and stated that they faxed over documents for [facility] for the patient on 10/16. She stated that they will be re-sending today.

## 2023-01-31 NOTE — Telephone Encounter (Signed)
Waiting to receive faxed FL2 form.

## 2023-02-04 NOTE — Telephone Encounter (Signed)
Left message at [facility] that we have not received form yet and if they could resend again or call us to email it.

## 2023-02-22 ENCOUNTER — Encounter: Payer: Self-pay | Admitting: Family Medicine

## 2023-02-24 ENCOUNTER — Other Ambulatory Visit: Payer: Self-pay | Admitting: Family Medicine

## 2023-02-26 ENCOUNTER — Ambulatory Visit: Payer: PPO | Admitting: Physician Assistant

## 2023-02-28 NOTE — Telephone Encounter (Signed)
Lupita Leash [facility]) called to follow up on FL2 status. Advised her that per previous notes it looked like we had not received it. Rep would like to have a call back when possible to see about other options to get this to [place]. She stated she would also attempt to refax it.   [phone]

## 2023-02-28 NOTE — Telephone Encounter (Signed)
Called and spoke with Lupita Leash [facility]) and suggested she scan the documentation to my email to ensure we receive it.

## 2023-03-18 NOTE — Telephone Encounter (Signed)
Erin Steele called back & stated she sent it via email on the 22nd and faxed it. She has not heard back & they need it back by today.

## 2023-03-18 NOTE — Telephone Encounter (Signed)
Called Lupita Leash at [facility] and spoke with Desiree. She gave me her email address and I scanned the paperwork to the facility this time. Follow up call was made to ensure it was received.

## 2023-03-19 DIAGNOSIS — H903 Sensorineural hearing loss, bilateral: Secondary | ICD-10-CM | POA: Diagnosis not present

## 2023-03-21 NOTE — Telephone Encounter (Signed)
Erin Steele called back and stated that she will be faxing a POS form again to [place]. Please advise.

## 2023-03-22 NOTE — Telephone Encounter (Signed)
Called and spoke with Lupita Leash. I let her know we received her fax and Dr. Abner Greenspan will be back in the office Tuesday 03/26/2023

## 2023-04-11 DIAGNOSIS — H903 Sensorineural hearing loss, bilateral: Secondary | ICD-10-CM | POA: Diagnosis not present

## 2023-04-18 ENCOUNTER — Encounter: Payer: Self-pay | Admitting: Cardiology

## 2023-04-18 ENCOUNTER — Ambulatory Visit: Payer: PPO | Attending: Cardiology | Admitting: Cardiology

## 2023-04-18 ENCOUNTER — Encounter: Payer: Self-pay | Admitting: Family Medicine

## 2023-04-18 VITALS — BP 104/62 | HR 80 | Ht 64.0 in | Wt 168.0 lb

## 2023-04-18 DIAGNOSIS — I2510 Atherosclerotic heart disease of native coronary artery without angina pectoris: Secondary | ICD-10-CM

## 2023-04-18 DIAGNOSIS — I5022 Chronic systolic (congestive) heart failure: Secondary | ICD-10-CM

## 2023-04-18 DIAGNOSIS — I4821 Permanent atrial fibrillation: Secondary | ICD-10-CM

## 2023-04-18 DIAGNOSIS — D6869 Other thrombophilia: Secondary | ICD-10-CM

## 2023-04-18 DIAGNOSIS — I4811 Longstanding persistent atrial fibrillation: Secondary | ICD-10-CM

## 2023-04-18 NOTE — Progress Notes (Signed)
 Cardiology Office Note:  .   Date:  04/18/2023  ID:  Arlyne Pine, DOB [birth year], MRN [id number] PCP: Domenica Harlene LABOR, MD  [facility] Providers Cardiologist:  Oneil Parchment, MD    History of Present Illness: .   Erin Steele is a 89 y.o. female Discussed the use of AI scribe software for clinical note transcription with the patient, who gave verbal consent to proceed.  History of Present Illness   The patient is a 89 year old individual with a complex medical history including persistent atrial fibrillation, nonobstructive coronary artery disease, chronic systolic heart failure, hypertension, diabetes, prior carcinoid tumor of the small intestine, and hypothyroidism.    She is on chronic anticoagulation with Eliquis . She has a CHADS-VASc score of six and is currently rate-controlled for her atrial fibrillation. She has had no significant bleeding episodes recently after 2022 fall. Her most recent EF was 50-55%, which had improved on Entresto in the past. She is currently on both a beta blocker and an angiotensin receptor blocker. Her mitral regurgitation has been considered moderate.  The patient reports feeling short of breath, particularly during physical activity. She also reports leg pain that keeps her up at night. She has been managing this pain with Tylenol. She has not had any significant falls or bleeding episodes recently. She has had a sinus infection in the past, which has resulted in a chronically runny nose. She has not had any significant bleeding episodes related to this.  The patient's medications include Metoprolol  25mg  daily, Losartan  25mg  daily, Lasix  20mg  daily, and Pravachol  20mg  daily for hyperlipidemia. She has been adherent to her medication regimen. She has not started any new medications recently.  The patient expresses frustration with her decreased mobility and independence. She reports feeling useless and dislikes having to ask for help. Despite  these feelings, she continues to stay as active as possible and is committed to following her doctors' advice to keep moving.           Studies Reviewed: SABRA   EKG Interpretation Date/Time:  Thursday April 18 2023 08:49:49 EST Ventricular Rate:  80 PR Interval:    QRS Duration:  86 QT Interval:  368 QTC Calculation: 424 R Axis:   59  Text Interpretation: Atrial fibrillation Cannot rule out Anterior infarct , age undetermined No previous ECGs available Confirmed by Parchment Oneil ([id number]) on 04/18/2023 9:06:13 AM    Results   LABS Hb: 12.2 (10/23/2022) Cr: 1.09 (10/23/2022) LDL: 31 (10/23/2022)  DIAGNOSTIC EF: 50-55% EKG: Atrial fibrillation, stable heart rate (04/18/2023)     Risk Assessment/Calculations:            Physical Exam:   VS:  BP 104/62   Pulse 80   Ht 5' 4 (1.626 m)   Wt 168 lb (76.2 kg)   SpO2 96%   BMI 28.84 kg/m    Wt Readings from Last 3 Encounters:  04/18/23 168 lb (76.2 kg)  10/23/22 165 lb (74.8 kg)  08/02/21 154 lb (69.9 kg)    GEN: Well nourished, well developed in no acute distress NECK: No JVD; No carotid bruits CARDIAC: Irreg normal rate, no murmurs, no rubs, no gallops RESPIRATORY:  Clear to auscultation without rales, wheezing or rhonchi  ABDOMEN: Soft, non-tender, non-distended EXTREMITIES:  No edema; No deformity   ASSESSMENT AND PLAN: .    Assessment and Plan    Atrial Fibrillation Long-standing persistent atrial fibrillation, currently rate controlled. CHADS-VASc score of 6. EKG today showed atrial fibrillation with stable heart  rate. On Eliquis  for stroke prevention, with no recent bleeding issues. Discussed bleeding risk with Eliquis  and reassessment if falls or other bleeding risks increase. - Continue Eliquis  - Continue metoprolol  25 mg daily  Chronic Systolic Heart Failure Chronic systolic heart failure with prior EF of 50-55%, improved on Entresto. Currently on beta blocker and angiotensin receptor blocker. Reports  shortness of breath and decreased stamina. Advised to maintain physical activity as tolerated to improve stamina. - Continue current medications - Encourage physical activity as tolerated  Hypertension Hypertension, managed with losartan  25 mg daily. - Continue losartan  25 mg daily, well controlled  Hyperlipidemia Hyperlipidemia with LDL of 31, managed with pravastatin  20 mg daily. - Continue pravastatin  20 mg daily  Hypothyroidism Hypothyroidism. No specific issues discussed during this visit. - Continue current management, Synthroid  25 mcg  General Health Maintenance Reports feeling useless and struggles with needing assistance. Encouraged to stay active and maintain physical activity. Discussed emotional support and the importance of maintaining independence safely. - Encourage physical activity - Discuss emotional support with primary care physician  Follow-up - Follow up with primary care physician, Dr. Carleton. Med refills in future under Dr. Carleton [facility] cardiology team if new issues arise.              Signed, Oneil Parchment, MD

## 2023-04-18 NOTE — Patient Instructions (Signed)
 Medication Instructions:  The current medical regimen is effective;  continue present plan and medications.  *If you need a refill on your cardiac medications before your next appointment, please call your pharmacy*  Follow-Up: At [facility], you and your health needs are our priority.  As part of our continuing mission to provide you with exceptional heart care, we have created designated Provider Care Teams.  These Care Teams include your primary Cardiologist (physician) and Advanced Practice Providers (APPs -  Physician Assistants and Nurse Practitioners) who all work together to provide you with the care you need, when you need it.  We recommend signing up for the patient portal called "MyChart".  Sign up information is provided on this After Visit Summary.  MyChart is used to connect with patients for Virtual Visits (Telemedicine).  Patients are able to view lab/test results, encounter notes, upcoming appointments, etc.  Non-urgent messages can be sent to your provider as well.   To learn more about what you can do with MyChart, go to [email or web address].    Your next appointment:   Follow up as needed with Dr Anne Fu

## 2023-04-25 ENCOUNTER — Ambulatory Visit: Payer: Self-pay | Admitting: Family Medicine

## 2023-04-25 NOTE — Telephone Encounter (Signed)
  Chief Complaint: Sinus pain/congestion Symptoms: Sinus pain, congestion, runny nose, cough Frequency: Ongoing x 2 days Pertinent Negatives: Patient denies fever Disposition: [] ED /[] Urgent Care (no appt availability in office) / [] Appointment(In office/virtual)/ []  [facility]/ [x] Home Care/ [] Refused Recommended Disposition /[] [place] [facility]/ []  Follow-up with PCP Additional Notes: Pt's nurse Desiree called to advise pt has been experiencing sinus pain stating "my face hurts," runny nose, congestion and cough for 2 days. Pt/nurse deny fever. This RN educated on home care, new-worsening symptoms. Pt's nurse is requesting an atb be called in for pt,  advised an appt may need to be made but she would like message sent to provider first. Verbalized understanding and agrees to plan.   Copied from CRM [id number]. Topic: Clinical - Red Word Triage >> Apr 25, 2023 12:07 PM Florestine Avers wrote: Red Word that prompted transfer to Nurse Triage: Nurse Cristie Hem from [facility] called in saying the patient has a sinus infection, no fever and would like something called into the pharmacy on file. Reason for Disposition  [1] Sinus congestion as part of a cold AND [2] present < 10 days  Answer Assessment - Initial Assessment Questions 1. LOCATION: "Where does it hurt?"      "Face hurts" 2. ONSET: "When did the sinus pain start?"  (e.g., hours, days)      A couple days ago per pt  5. NASAL CONGESTION: "Is the nose blocked?" If Yes, ask: "Can you open it or must you breathe through your mouth?"     Yes 6. NASAL DISCHARGE: "Do you have discharge from your nose?" If so ask, "What color?"     Clear 7. FEVER: "Do you have a fever?" If Yes, ask: "What is it, how was it measured, and when did it start?"      None 8. OTHER SYMPTOMS: "Do you have any other symptoms?" (e.g., sore throat, cough, earache, difficulty breathing)     Small nose bleed last night  Protocols used: Sinus Pain or  Congestion-A-AH

## 2023-05-17 ENCOUNTER — Encounter: Payer: Self-pay | Admitting: Family Medicine

## 2023-05-17 DIAGNOSIS — Z111 Encounter for screening for respiratory tuberculosis: Secondary | ICD-10-CM

## 2023-05-17 NOTE — Telephone Encounter (Signed)
 Copied from CRM [id number]. Topic: Clinical - Request for Lab/Test Order >> May 17, 2023 12:05 PM Alexandria E wrote: Reason for CRM: Patient's daughter called, Arna Cory, and she is in the process of transferring her mother to a different assisted living facility, but patient needs a TB test done in order to make the transfer. Callback number for Arna is [phone]. Advised her that the clinic will give her a call to set up this appointment.

## 2023-05-20 NOTE — Telephone Encounter (Signed)
 Spoke with patient's daughter. She has an appointment with the new facility this Thursday (05/23/23) The facility is requesting a TB skin be done as part of the admission process. Per patient's daughter, patient has never tested positive TB. Dr. Geralyn Knee has agreed to place the order and patient has an in person office visit schedule this Thursday with Minna Amass

## 2023-05-20 NOTE — Telephone Encounter (Signed)
 Noted, will check her arm on Thursday at her appt after TB injection Tuesday.

## 2023-05-21 ENCOUNTER — Ambulatory Visit (INDEPENDENT_AMBULATORY_CARE_PROVIDER_SITE_OTHER): Payer: PPO

## 2023-05-21 DIAGNOSIS — Z111 Encounter for screening for respiratory tuberculosis: Secondary | ICD-10-CM | POA: Diagnosis not present

## 2023-05-21 NOTE — Progress Notes (Signed)
Pt here for TB skin test.  Ordered by PCP for transferring into a new assisted living center.  Placed left arm.

## 2023-05-23 ENCOUNTER — Encounter: Payer: Self-pay | Admitting: Family Medicine

## 2023-05-23 ENCOUNTER — Ambulatory Visit: Payer: PPO | Admitting: Family Medicine

## 2023-05-23 VITALS — BP 108/67 | HR 80 | Ht 64.0 in | Wt 166.0 lb

## 2023-05-23 DIAGNOSIS — I4821 Permanent atrial fibrillation: Secondary | ICD-10-CM | POA: Diagnosis not present

## 2023-05-23 DIAGNOSIS — I10 Essential (primary) hypertension: Secondary | ICD-10-CM

## 2023-05-23 DIAGNOSIS — E039 Hypothyroidism, unspecified: Secondary | ICD-10-CM

## 2023-05-23 DIAGNOSIS — I5022 Chronic systolic (congestive) heart failure: Secondary | ICD-10-CM

## 2023-05-23 LAB — TB SKIN TEST
Induration: 0 mm
TB Skin Test: NEGATIVE

## 2023-05-23 NOTE — Assessment & Plan Note (Signed)
-  Continue current medication regimen.   -Check blood pressure regularly at home.

## 2023-05-23 NOTE — Assessment & Plan Note (Signed)
Asymptomatic Following with cardiology Continue current meds

## 2023-05-23 NOTE — Assessment & Plan Note (Signed)
-  Continue Levothyroxine once a day.

## 2023-05-23 NOTE — Assessment & Plan Note (Signed)
Asymptomatic  Continue current meds Following with cardiology

## 2023-05-23 NOTE — Progress Notes (Signed)
Established Patient Office Visit  Subjective   Patient ID: Erin Steele, female    DOB: [birth year]  Age: 89 y.o. MRN: [id number]  Chief Complaint  Patient presents with   Medical Management of Chronic Issues    HPI   Discussed the use of AI scribe software for clinical note transcription with the patient, who gave verbal consent to proceed.  History of Present Illness   Erin Steele is a 89 year old female with atrial fibrillation and hypertension who presents for medication management and self-administration approval. She is accompanied by a caregiver.  She resides in an assisted living facility and is in the process of moving to a new location. She has been self-administering her medications for over a year and a half due to previous issues with staff medication errors at her current facility. She maintains a list of her medications and is currently taking metoprolol 25 mg once daily, potassium 20 mg once daily, losartan 25 mg once daily, pravastatin 20 mg once daily, furosemide 20 mg once daily, levothyroxine 25 mcg once daily, and Eliquis 5 mg twice daily. She also uses Questran powder for gastrointestinal issues as needed and a nasal spray as needed.  Her atrial fibrillation is managed with Eliquis 5 mg twice daily, and hypertension is controlled with metoprolol 25 mg once daily and losartan 25 mg once daily. No recent exacerbations or complications have been reported. Hypothyroidism is managed with levothyroxine 25 mcg once daily, with no recent symptoms indicating poor control. Heart failure is stable and managed with furosemide 20 mg once daily, adjusted as needed for fluid management. No recent symptoms of decompensation have been noted.  Hyperlipidemia is managed with pravastatin 20 mg once daily. She experiences chronic leg pain, which she manages with CBD oil in the form of a cream and capsules, reporting it as effective. She prefers not to use traditional pain  medications.  She has post-surgical gastrointestinal issues managed with a prescribed Questran powder, which she finds more tolerable when mixed with yogurt. She is ambulatory, exercises daily, and maintains a regular diet. She monitors her blood pressure at home and has no diabetes or need for oxygen. She uses hearing aids and glasses due to hearing and vision impairments. She has a history of allergies to morphine, codeine, and penicillin, though the reactions are uncertain as they occurred many years ago.             ROS All review of systems negative except what is listed in the HPI    Objective:     BP 108/67   Pulse 80   Ht 5\' 4"  (1.626 m)   Wt 166 lb (75.3 kg)   SpO2 99%   BMI 28.49 kg/m    Physical Exam Vitals reviewed.  Constitutional:      Appearance: Normal appearance.  Cardiovascular:     Rate and Rhythm: Normal rate and regular rhythm.  Pulmonary:     Effort: Pulmonary effort is normal.     Breath sounds: Normal breath sounds.  Musculoskeletal:     Right lower leg: No edema.     Left lower leg: No edema.  Skin:    General: Skin is warm and dry.  Neurological:     Mental Status: She is alert and oriented to person, place, and time.  Psychiatric:        Mood and Affect: Mood normal.        Behavior: Behavior normal.        Thought  Content: Thought content normal.        Judgment: Judgment normal.      No results found for any visits on 05/23/23.    The ASCVD Risk score (Arnett DK, et al., 2019) failed to calculate for the following reasons:   The 2019 ASCVD risk score is only valid for ages 22 to 17    Assessment & Plan:   Problem List Items Addressed This Visit       Active Problems   Permanent atrial fibrillation (HCC)   Asymptomatic. Following with cardiology. Continue current meds      Hypertension - Primary   -Continue current medication regimen.   -Check blood pressure regularly at home.      Hypothyroid   -Continue  Levothyroxine once a day.      Chronic systolic heart failure (HCC)   Asymptomatic. Continue current meds. Following with cardiology.      Patient has been self-administering medications for over a year and is knowledgeable about her medications, their dosages, and potential side effects. She keeps her medications in their original bottles and checks the names before taking them. She is moving to a new assisted living facility and will continue to self-administer her medications there.  She declned updating labs today. She will have this done at upcoming PCP appointment.     Return if symptoms worsen or fail to improve, for ; keep upcoming PCP appointment.    Clayborne Dana, NP

## 2023-06-12 ENCOUNTER — Encounter: Payer: Self-pay | Admitting: Family Medicine

## 2023-06-14 ENCOUNTER — Encounter: Payer: Self-pay | Admitting: Family Medicine

## 2023-07-17 ENCOUNTER — Other Ambulatory Visit: Payer: Self-pay | Admitting: Family Medicine

## 2023-07-24 DIAGNOSIS — H02834 Dermatochalasis of left upper eyelid: Secondary | ICD-10-CM | POA: Diagnosis not present

## 2023-07-24 DIAGNOSIS — H26493 Other secondary cataract, bilateral: Secondary | ICD-10-CM | POA: Diagnosis not present

## 2023-07-24 DIAGNOSIS — H02831 Dermatochalasis of right upper eyelid: Secondary | ICD-10-CM | POA: Diagnosis not present

## 2023-07-24 DIAGNOSIS — H524 Presbyopia: Secondary | ICD-10-CM | POA: Diagnosis not present

## 2023-07-24 DIAGNOSIS — H11151 Pinguecula, right eye: Secondary | ICD-10-CM | POA: Diagnosis not present

## 2023-07-30 ENCOUNTER — Encounter: Payer: PPO | Admitting: Family Medicine

## 2023-07-30 ENCOUNTER — Other Ambulatory Visit: Payer: Self-pay | Admitting: Family

## 2023-08-01 ENCOUNTER — Ambulatory Visit: Payer: PPO | Admitting: Family Medicine

## 2023-08-04 NOTE — Assessment & Plan Note (Signed)
 hgba1c acceptable, minimize simple carbs. Increase exercise as tolerated. Continue current meds. Had flu shot at her facility will request. Declines further covid shots, believes she had a tetanus shot about 5 years ago before she moved here. Advised to take one if she gets injured.

## 2023-08-04 NOTE — Assessment & Plan Note (Signed)
 Hydrate and monitor

## 2023-08-04 NOTE — Assessment & Plan Note (Signed)
 Well controlled, no changes to meds. Encouraged heart healthy diet such as the DASH diet and exercise as tolerated.

## 2023-08-04 NOTE — Assessment & Plan Note (Signed)
 Supplement and monitor

## 2023-08-04 NOTE — Assessment & Plan Note (Signed)
 Encourage heart healthy diet such as MIND or DASH diet, increase exercise, avoid trans fats, simple carbohydrates and processed foods, consider a krill or fish or flaxseed oil cap daily.

## 2023-08-04 NOTE — Assessment & Plan Note (Signed)
Tolerating Eliquis, rate controlled 

## 2023-08-04 NOTE — Assessment & Plan Note (Signed)
 No recent exacerbation

## 2023-08-05 DIAGNOSIS — Z961 Presence of intraocular lens: Secondary | ICD-10-CM | POA: Diagnosis not present

## 2023-08-05 DIAGNOSIS — H11151 Pinguecula, right eye: Secondary | ICD-10-CM | POA: Diagnosis not present

## 2023-08-05 DIAGNOSIS — H26493 Other secondary cataract, bilateral: Secondary | ICD-10-CM | POA: Diagnosis not present

## 2023-08-08 ENCOUNTER — Encounter: Payer: Self-pay | Admitting: Family Medicine

## 2023-08-08 ENCOUNTER — Ambulatory Visit (INDEPENDENT_AMBULATORY_CARE_PROVIDER_SITE_OTHER): Admitting: Family Medicine

## 2023-08-08 VITALS — BP 118/78 | HR 87 | Temp 97.6°F | Resp 16 | Ht 64.0 in | Wt 168.0 lb

## 2023-08-08 DIAGNOSIS — I5022 Chronic systolic (congestive) heart failure: Secondary | ICD-10-CM | POA: Diagnosis not present

## 2023-08-08 DIAGNOSIS — R739 Hyperglycemia, unspecified: Secondary | ICD-10-CM

## 2023-08-08 DIAGNOSIS — I4821 Permanent atrial fibrillation: Secondary | ICD-10-CM

## 2023-08-08 DIAGNOSIS — R252 Cramp and spasm: Secondary | ICD-10-CM

## 2023-08-08 DIAGNOSIS — I10 Essential (primary) hypertension: Secondary | ICD-10-CM

## 2023-08-08 DIAGNOSIS — E7800 Pure hypercholesterolemia, unspecified: Secondary | ICD-10-CM | POA: Diagnosis not present

## 2023-08-08 DIAGNOSIS — E559 Vitamin D deficiency, unspecified: Secondary | ICD-10-CM | POA: Diagnosis not present

## 2023-08-08 DIAGNOSIS — E1121 Type 2 diabetes mellitus with diabetic nephropathy: Secondary | ICD-10-CM | POA: Diagnosis not present

## 2023-08-08 LAB — CBC WITH DIFFERENTIAL/PLATELET
Basophils Absolute: 0 10*3/uL (ref 0.0–0.1)
Basophils Relative: 0.2 % (ref 0.0–3.0)
Eosinophils Absolute: 0.2 10*3/uL (ref 0.0–0.7)
Eosinophils Relative: 2.3 % (ref 0.0–5.0)
HCT: 37.8 % (ref 36.0–46.0)
Hemoglobin: 12.5 g/dL (ref 12.0–15.0)
Lymphocytes Relative: 30.3 % (ref 12.0–46.0)
Lymphs Abs: 2.3 10*3/uL (ref 0.7–4.0)
MCHC: 33 g/dL (ref 30.0–36.0)
MCV: 99.6 fl (ref 78.0–100.0)
Monocytes Absolute: 0.7 10*3/uL (ref 0.1–1.0)
Monocytes Relative: 9.6 % (ref 3.0–12.0)
Neutro Abs: 4.4 10*3/uL (ref 1.4–7.7)
Neutrophils Relative %: 57.6 % (ref 43.0–77.0)
Platelets: 216 10*3/uL (ref 150.0–400.0)
RBC: 3.8 Mil/uL — ABNORMAL LOW (ref 3.87–5.11)
RDW: 14.3 % (ref 11.5–15.5)
WBC: 7.7 10*3/uL (ref 4.0–10.5)

## 2023-08-08 LAB — HEMOGLOBIN A1C: Hgb A1c MFr Bld: 6.1 % (ref 4.6–6.5)

## 2023-08-08 LAB — VITAMIN D 25 HYDROXY (VIT D DEFICIENCY, FRACTURES): VITD: 42.07 ng/mL (ref 30.00–100.00)

## 2023-08-08 LAB — COMPREHENSIVE METABOLIC PANEL WITH GFR
ALT: 14 U/L (ref 0–35)
AST: 18 U/L (ref 0–37)
Albumin: 4.5 g/dL (ref 3.5–5.2)
Alkaline Phosphatase: 69 U/L (ref 39–117)
BUN: 19 mg/dL (ref 6–23)
CO2: 23 meq/L (ref 19–32)
Calcium: 9.7 mg/dL (ref 8.4–10.5)
Chloride: 110 meq/L (ref 96–112)
Creatinine, Ser: 1.22 mg/dL — ABNORMAL HIGH (ref 0.40–1.20)
GFR: 38.16 mL/min — ABNORMAL LOW (ref >60.00)
Glucose, Bld: 111 mg/dL — ABNORMAL HIGH (ref 70–99)
Potassium: 4.3 meq/L (ref 3.5–5.1)
Sodium: 142 meq/L (ref 135–145)
Total Bilirubin: 0.8 mg/dL (ref 0.2–1.2)
Total Protein: 6.8 g/dL (ref 6.0–8.3)

## 2023-08-08 LAB — MAGNESIUM: Magnesium: 1.7 mg/dL (ref 1.5–2.5)

## 2023-08-08 LAB — LIPID PANEL
Cholesterol: 102 mg/dL (ref 0–200)
HDL: 49.6 mg/dL (ref >39.00)
LDL Cholesterol: 26 mg/dL (ref 0–99)
NonHDL: 52.3
Total CHOL/HDL Ratio: 2
Triglycerides: 134 mg/dL (ref 0.0–149.0)
VLDL: 26.8 mg/dL (ref 0.0–40.0)

## 2023-08-08 LAB — TSH: TSH: 8.28 u[IU]/mL — ABNORMAL HIGH (ref 0.35–5.50)

## 2023-08-08 MED ORDER — SERTRALINE HCL 25 MG PO TABS: 25.0000 mg | ORAL_TABLET | Freq: Every day | ORAL | 3 refills | Status: DC

## 2023-08-08 NOTE — Patient Instructions (Signed)
 Shingrix is the new shingles shot, 2 shots over 2-6 months, confirm coverage with insurance and document, then can return here for shots with nurse appt or at pharmacy   Prevnar 20 once RSV, Respiratory Syncitial Virus Vaccine, Arexvy vaccine Tetanus preventatively or if injured

## 2023-08-11 ENCOUNTER — Encounter: Payer: Self-pay | Admitting: Family Medicine

## 2023-08-11 NOTE — Progress Notes (Signed)
 Subjective:    Patient ID: Erin Steele, female    DOB: 05/11/1927, 89 y.o.   MRN: [id number]  Chief Complaint  Patient presents with  . Medical Management of Chronic Issues    Patient presents today for a follow-up appointment.  . Quality Metric Gaps    A1C, AWV, foot&eye exam, DEXA scan, zoster, TDAP, pneumonia    HPI Discussed the use of AI scribe software for clinical note transcription with the patient, who gave verbal consent to proceed.  History of Present Illness Erin Steele is a 89 year old female who presents with leg pain and numbness.  She experiences significant pain and numbness in both legs, with the left leg being more affected. The pain is severe, causing crying and stomping due to cramps. She uses a CBD lotion and takes a tablet at night, which provides some relief but does not completely alleviate the pain. The cost of the CBD treatment is $75 per month.  Her legs feel numb yet painful, a sensation she finds difficult to articulate. Rubbing her legs or stomping her feet sometimes helps alleviate the discomfort. She uses a device that wraps around her feet to aid circulation, although it causes bruising on one foot. Her toes remain warm, and she does not experience pain from the bruising.  She has a history of muscle cramps and takes a vitamin D  supplement daily. She also takes cholestyramine  with yogurt to aid digestion. She drinks a lot of ice water, which she believes contributes to her overall well-being.  She expresses frustration with her loss of independence and the need for assistance with daily activities, which causes her stress. She previously took medication for anxiety but discontinued it due to experiencing vivid dreams. She manages her medications by organizing them weekly.  No current issues with bowel movements, thanks to the use of cholestyramine . Reports feeling sleepy.    Past Medical History:  Diagnosis Date  . A-fib (HCC)   .  Atrial fibrillation (HCC)   . Bilateral rotator cuff dysfunction   . CAD (coronary artery disease)   . CHF (congestive heart failure) (HCC)   . Diabetes mellitus with diabetic nephropathy (HCC)   . Diabetes mellitus without complication (HCC)   . High cholesterol   . HIV infection (HCC)   . Hypercalcemia 03/25/2018  . Hyperlipemia   . Hypertension   . Hypokalemia   . Osteoarthritis   . Vitamin D  deficiency     Past Surgical History:  Procedure Laterality Date  . ABDOMINAL HYSTERECTOMY    . ANKLE SURGERY     and arms  . APPENDECTOMY    . chicken pox    . intestinal surgery      for cancer  . KIDNEY STONE SURGERY    . measles      Family History  Problem Relation Age of Onset  . COPD Mother        emphysema, nonsmoker  . Heart disease Sister   . Cancer Sister        lung CA in smoker  . Heart disease Daughter   . Heart disease Maternal Grandmother   . Heart disease Maternal Grandfather   . Heart disease Daughter     Social History   Socioeconomic History  . Marital status: Married    Spouse name: Not on file  . Number of children: Not on file  . Years of education: Not on file  . Highest education level: Not on file  Occupational History  .  Occupation: retired  Tobacco Use  . Smoking status: Never  . Smokeless tobacco: Never  Substance and Sexual Activity  . Alcohol use: Yes    Comment: occasionally, mexican milkshake at Christmas  . Drug use: No  . Sexual activity: Not on file  Other Topics Concern  . Not on file  Social History Narrative       was a homemaker with many side jobs   Raised 4 children has relocated here from [facility] to be near family   Lives a [place] with husband   No dietary restrictions   Social Drivers of Corporate investment banker Strain: Not on file  Food Insecurity: Not on file  Transportation Needs: Not on file  Physical Activity: Not on file  Stress: Not on file  Social Connections: Unknown (06/05/2022)   Received  [facility]   Social Network   . Social Network: Not on file  Intimate Partner Violence: Unknown (06/05/2022)   [facility]   HITS   . Physically Hurt: Not on file   . Insult or Talk Down To: Not on file   . Threaten Physical Harm: Not on file   . Scream or Curse: Not on file    Outpatient Medications Prior to Visit  Medication Sig Dispense Refill  . apixaban  (ELIQUIS ) 5 MG TABS tablet Take 1 tablet (5 mg total) by mouth 2 (two) times daily. 90 tablet 3  . azelastine  (ASTELIN ) 0.1 % nasal spray Place 2 sprays into both nostrils 2 (two) times daily. Use in each nostril as directed 30 mL 12  . cholestyramine  (QUESTRAN ) 4 GM/DOSE powder USE 1/2 SCOOP 3 TIMES A DAY WITH MEALS 378 g 1  . furosemide  (LASIX ) 20 MG tablet TAKE 1 TABLET BY MOUTH EVERY DAY 90 tablet 2  . levothyroxine  (SYNTHROID ) 25 MCG tablet TAKE 1 TABLET BY MOUTH DAILY BEFORE BREAKFAST. 90 tablet 2  . loperamide (IMODIUM) 2 MG capsule Take 2 mg by mouth as needed for diarrhea or loose stools.    . losartan  (COZAAR ) 25 MG tablet TAKE 1 TABLET (25 MG TOTAL) BY MOUTH DAILY. 90 tablet 2  . metoprolol  succinate (TOPROL -XL) 25 MG 24 hr tablet TAKE 1 TABLET (25 MG TOTAL) BY MOUTH DAILY. 90 tablet 2  . potassium chloride  SA (KLOR-CON  M20) 20 MEQ tablet TAKE 1 TABLET BY MOUTH EVERY DAY 90 tablet 2  . pravastatin  (PRAVACHOL ) 20 MG tablet TAKE 1 TABLET BY MOUTH EVERY DAY 90 tablet 2   No facility-administered medications prior to visit.    Allergies  Allergen Reactions  . Morphine And Codeine Anxiety and Other (See Comments)    Pt is unsure of allergy-02/17/18 Pt is unsure of allergy-02/17/18 Pt is unsure of allergy-02/17/18   . Penicillins Other (See Comments)    Doesn't remember reaction Doesn't remember reaction   . Covid-19 (Mrna) Vaccine Other (See Comments)    myalgia    Review of Systems  Constitutional:  Positive for malaise/fatigue. Negative for fever.  HENT:   Negative for congestion.   Eyes:  Negative for blurred vision.  Respiratory:  Negative for shortness of breath.   Cardiovascular:  Negative for chest pain, palpitations and leg swelling.  Gastrointestinal:  Negative for abdominal pain, blood in stool and nausea.  Genitourinary:  Negative for dysuria and frequency.  Musculoskeletal:  Positive for back pain and joint pain. Negative for falls.  Skin:  Negative for rash.  Neurological:  Positive for sensory change.  Negative for dizziness, loss of consciousness and headaches.  Endo/Heme/Allergies:  Negative for environmental allergies.  Psychiatric/Behavioral:  Negative for depression. The patient is not nervous/anxious.        Objective:    Physical Exam Constitutional:      General: She is not in acute distress.    Appearance: Normal appearance. She is well-developed. She is not toxic-appearing.  HENT:     Head: Normocephalic and atraumatic.     Right Ear: External ear normal.     Left Ear: External ear normal.     Nose: Nose normal.  Eyes:     General:        Right eye: No discharge.        Left eye: No discharge.     Conjunctiva/sclera: Conjunctivae normal.  Neck:     Thyroid : No thyromegaly.  Cardiovascular:     Rate and Rhythm: Normal rate and regular rhythm.     Heart sounds: Normal heart sounds. No murmur heard. Pulmonary:     Effort: Pulmonary effort is normal. No respiratory distress.     Breath sounds: Normal breath sounds.  Abdominal:     General: Bowel sounds are normal.     Palpations: Abdomen is soft.     Tenderness: There is no abdominal tenderness. There is no guarding.  Musculoskeletal:        General: Normal range of motion.     Cervical back: Neck supple.  Lymphadenopathy:     Cervical: No cervical adenopathy.  Skin:    General: Skin is warm and dry.  Neurological:     Mental Status: She is alert and oriented to person, place, and time.  Psychiatric:        Mood and Affect: Mood normal.         Behavior: Behavior normal.        Thought Content: Thought content normal.        Judgment: Judgment normal.    BP 118/78   Pulse 87   Temp 97.6 F (36.4 C)   Resp 16   Ht 5\' 4"  (1.626 m)   Wt 168 lb (76.2 kg)   SpO2 94%   BMI 28.84 kg/m  Wt Readings from Last 3 Encounters:  08/08/23 168 lb (76.2 kg)  05/23/23 166 lb (75.3 kg)  04/18/23 168 lb (76.2 kg)    Diabetic Foot Exam - Simple   No data filed    Lab Results  Component Value Date   WBC 7.7 08/08/2023   HGB 12.5 08/08/2023   HCT 37.8 08/08/2023   PLT 216.0 08/08/2023   GLUCOSE 111 (H) 08/08/2023   CHOL 102 08/08/2023   TRIG 134.0 08/08/2023   HDL 49.60 08/08/2023   LDLDIRECT 28.0 01/19/2021   LDLCALC 26 08/08/2023   ALT 14 08/08/2023   AST 18 08/08/2023   NA 142 08/08/2023   K 4.3 08/08/2023   CL 110 08/08/2023   CREATININE 1.22 (H) 08/08/2023   BUN 19 08/08/2023   CO2 23 08/08/2023   TSH 8.28 (H) 08/08/2023   HGBA1C 6.1 08/08/2023    Lab Results  Component Value Date   TSH 8.28 (H) 08/08/2023   Lab Results  Component Value Date   WBC 7.7 08/08/2023   HGB 12.5 08/08/2023   HCT 37.8 08/08/2023   MCV 99.6 08/08/2023   PLT 216.0 08/08/2023   Lab Results  Component Value Date   NA 142 08/08/2023   K 4.3 08/08/2023   CO2 23 08/08/2023  GLUCOSE 111 (H) 08/08/2023   BUN 19 08/08/2023   CREATININE 1.22 (H) 08/08/2023   BILITOT 0.8 08/08/2023   ALKPHOS 69 08/08/2023   AST 18 08/08/2023   ALT 14 08/08/2023   PROT 6.8 08/08/2023   ALBUMIN 4.5 08/08/2023   CALCIUM 9.7 08/08/2023   GFR 38.16 (L) 08/08/2023   Lab Results  Component Value Date   CHOL 102 08/08/2023   Lab Results  Component Value Date   HDL 49.60 08/08/2023   Lab Results  Component Value Date   LDLCALC 26 08/08/2023   Lab Results  Component Value Date   TRIG 134.0 08/08/2023   Lab Results  Component Value Date   CHOLHDL 2 08/08/2023   Lab Results  Component Value Date   HGBA1C 6.1 08/08/2023        Assessment & Plan:  Chronic systolic heart failure (HCC) Assessment & Plan: No recent exacerbation   Type 2 diabetes mellitus with diabetic nephropathy, unspecified whether long term insulin use (HCC) Assessment & Plan: hgba1c acceptable, minimize simple carbs. Increase exercise as tolerated. Continue current meds. Had flu shot at her facility will request. Declines further covid shots, believes she had a tetanus shot about 5 years ago before she moved here. Advised to take one if she gets injured.    Hypertension, unspecified type Assessment & Plan: Well controlled, no changes to meds. Encouraged heart healthy diet such as the DASH diet and exercise as tolerated.   Orders: -     Comprehensive metabolic panel with GFR -     CBC with Differential/Platelet -     TSH  Vitamin D  deficiency Assessment & Plan: Supplement and monitor  Orders: -     VITAMIN D  25 Hydroxy (Vit-D Deficiency, Fractures)  Permanent atrial fibrillation [facility]) Assessment & Plan: Tolerating Eliquis , rate controlled   Muscle cramps Assessment & Plan: Hydrate and monitor  Orders: -     Magnesium  High cholesterol Assessment & Plan: Encourage heart healthy diet such as MIND or DASH diet, increase exercise, avoid trans fats, simple carbohydrates and processed foods, consider a krill or fish or flaxseed oil cap daily.   Orders: -     Comprehensive metabolic panel with GFR -     CBC with Differential/Platelet -     Lipid panel  Hyperglycemia -     Hemoglobin A1c  Other orders -     Sertraline  HCl; Take 1 tablet (25 mg total) by mouth daily.  Dispense: 30 tablet; Refill: 3    Assessment and Plan Assessment & Plan Anxiety Anxiety related to loss of independence. Previously on fluoxetine , discontinued due to nightmares. Discussed sertraline  as an alternative with fewer side effects. - Prescribe sertraline  at the lowest dose. - Schedule follow-up in 4-6 months to assess response, option for  virtual visit.  Peripheral neuropathy Chronic peripheral neuropathy with pain, numbness, and cramps. CBD provides some relief but is costly. Discussed nerve pain management and alternative therapies. - Encourage continued use of CBD products. - Suggest Biofreeze with CBD for additional relief. - Recommend elevating feet above heart for 10-15 minutes twice daily. - Advise loosening foot massager to prevent bruising.  Wellness Visit Reviewed dietary habits, hydration, and current medications. Discussed monitoring kidney and liver function due to CBD use. - Order comprehensive metabolic panel. - Order hemoglobin A1c. - Order vitamin D  level. - Order magnesium level. - Order thyroid  function tests, cholesterol panel, and complete blood count. - Encourage hydration with ice water. - Discuss dietary modifications to  manage carbohydrate intake.  Vitamin D  deficiency On vitamin D  supplementation for bone health. - Continue daily vitamin D  supplementation. - Check vitamin D  level.  Shingles History of shingles. Discussed shingles vaccine benefits in reducing recurrence and dementia risk. - Consider shingles vaccination.  COVID-19 vaccine intolerance Adverse effects from COVID-19 vaccine, prefers no further vaccinations. - Document COVID-19 vaccine intolerance in allergy list.  General Health Maintenance Discussed benefits of various vaccinations for preventing serious illness in older adults. - Consider tetanus vaccination if injured. - Consider annual flu vaccination. - Consider pneumonia (Prevnar 20) vaccination. - Consider RSV vaccination. - Check with [facility] and [facility] for vaccination records.     Randie Bustle, MD

## 2023-08-13 ENCOUNTER — Other Ambulatory Visit: Payer: Self-pay | Admitting: Family Medicine

## 2023-08-13 ENCOUNTER — Other Ambulatory Visit: Payer: Self-pay

## 2023-08-13 DIAGNOSIS — E039 Hypothyroidism, unspecified: Secondary | ICD-10-CM

## 2023-08-13 MED ORDER — LEVOTHYROXINE SODIUM 50 MCG PO TABS: 50.0000 ug | ORAL_TABLET | Freq: Every day | ORAL | 3 refills | Status: DC

## 2023-08-31 ENCOUNTER — Other Ambulatory Visit: Payer: Self-pay | Admitting: Family Medicine

## 2023-10-09 ENCOUNTER — Other Ambulatory Visit: Payer: Self-pay | Admitting: Family Medicine

## 2023-11-05 ENCOUNTER — Other Ambulatory Visit

## 2023-11-13 ENCOUNTER — Other Ambulatory Visit: Payer: Self-pay | Admitting: Family Medicine

## 2023-11-13 DIAGNOSIS — E039 Hypothyroidism, unspecified: Secondary | ICD-10-CM

## 2023-11-20 ENCOUNTER — Other Ambulatory Visit: Payer: Self-pay | Admitting: Family Medicine

## 2023-11-24 NOTE — Assessment & Plan Note (Signed)
 hgba1c acceptable, minimize simple carbs. Increase exercise as tolerated. Continue current meds. Had flu shot at her facility will request. Declines further covid shots, believes she had a tetanus shot about 5 years ago before she moved here. Advised to take one if she gets injured.

## 2023-11-24 NOTE — Assessment & Plan Note (Signed)
 Hydrate and monitor

## 2023-11-24 NOTE — Assessment & Plan Note (Signed)
 Well controlled, no changes to meds. Encouraged heart healthy diet such as the DASH diet and exercise as tolerated.

## 2023-11-24 NOTE — Assessment & Plan Note (Signed)
 No recent exacerbation

## 2023-11-24 NOTE — Assessment & Plan Note (Signed)
 Supplement and monitor

## 2023-11-24 NOTE — Assessment & Plan Note (Signed)
 On Levothyroxine, continue to monitor

## 2023-11-24 NOTE — Assessment & Plan Note (Signed)
 Encourage heart healthy diet such as MIND or DASH diet, increase exercise, avoid trans fats, simple carbohydrates and processed foods, consider a krill or fish or flaxseed oil cap daily.

## 2023-11-25 ENCOUNTER — Ambulatory Visit (INDEPENDENT_AMBULATORY_CARE_PROVIDER_SITE_OTHER): Admitting: Family Medicine

## 2023-11-25 VITALS — BP 122/78 | HR 74 | Temp 97.9°F | Resp 14 | Ht 64.0 in | Wt 169.0 lb

## 2023-11-25 DIAGNOSIS — R252 Cramp and spasm: Secondary | ICD-10-CM | POA: Diagnosis not present

## 2023-11-25 DIAGNOSIS — E7800 Pure hypercholesterolemia, unspecified: Secondary | ICD-10-CM | POA: Diagnosis not present

## 2023-11-25 DIAGNOSIS — R739 Hyperglycemia, unspecified: Secondary | ICD-10-CM

## 2023-11-25 DIAGNOSIS — I4821 Permanent atrial fibrillation: Secondary | ICD-10-CM | POA: Diagnosis not present

## 2023-11-25 DIAGNOSIS — E039 Hypothyroidism, unspecified: Secondary | ICD-10-CM | POA: Diagnosis not present

## 2023-11-25 DIAGNOSIS — I5022 Chronic systolic (congestive) heart failure: Secondary | ICD-10-CM

## 2023-11-25 DIAGNOSIS — E559 Vitamin D deficiency, unspecified: Secondary | ICD-10-CM

## 2023-11-25 DIAGNOSIS — E1121 Type 2 diabetes mellitus with diabetic nephropathy: Secondary | ICD-10-CM

## 2023-11-25 DIAGNOSIS — I10 Essential (primary) hypertension: Secondary | ICD-10-CM | POA: Diagnosis not present

## 2023-11-25 MED ORDER — CHOLESTYRAMINE 4 GM/DOSE PO POWD: ORAL | 1 refills | Status: DC

## 2023-11-25 NOTE — Assessment & Plan Note (Signed)
 Rate controlled

## 2023-11-25 NOTE — Patient Instructions (Signed)

## 2023-11-26 ENCOUNTER — Encounter: Payer: Self-pay | Admitting: Family Medicine

## 2023-11-26 ENCOUNTER — Ambulatory Visit: Payer: Self-pay | Admitting: Family Medicine

## 2023-11-26 LAB — COMPREHENSIVE METABOLIC PANEL WITH GFR
ALT: 13 U/L (ref 0–35)
AST: 18 U/L (ref 0–37)
Albumin: 4.7 g/dL (ref 3.5–5.2)
Alkaline Phosphatase: 70 U/L (ref 39–117)
BUN: 26 mg/dL — ABNORMAL HIGH (ref 6–23)
CO2: 24 meq/L (ref 19–32)
Calcium: 9.7 mg/dL (ref 8.4–10.5)
Chloride: 108 meq/L (ref 96–112)
Creatinine, Ser: 1.24 mg/dL — ABNORMAL HIGH (ref 0.40–1.20)
GFR: 37.34 mL/min — ABNORMAL LOW (ref >60.00)
Glucose, Bld: 95 mg/dL (ref 70–99)
Potassium: 4.1 meq/L (ref 3.5–5.1)
Sodium: 142 meq/L (ref 135–145)
Total Bilirubin: 0.7 mg/dL (ref 0.2–1.2)
Total Protein: 6.8 g/dL (ref 6.0–8.3)

## 2023-11-26 LAB — CBC WITH DIFFERENTIAL/PLATELET
Basophils Absolute: 0.1 K/uL (ref 0.0–0.1)
Basophils Relative: 0.8 % (ref 0.0–3.0)
Eosinophils Absolute: 0.1 K/uL (ref 0.0–0.7)
Eosinophils Relative: 1.5 % (ref 0.0–5.0)
HCT: 39.4 % (ref 36.0–46.0)
Hemoglobin: 13 g/dL (ref 12.0–15.0)
Lymphocytes Relative: 29.3 % (ref 12.0–46.0)
Lymphs Abs: 2.4 K/uL (ref 0.7–4.0)
MCHC: 33.1 g/dL (ref 30.0–36.0)
MCV: 97.9 fl (ref 78.0–100.0)
Monocytes Absolute: 0.8 K/uL (ref 0.1–1.0)
Monocytes Relative: 9.3 % (ref 3.0–12.0)
Neutro Abs: 4.9 K/uL (ref 1.4–7.7)
Neutrophils Relative %: 59.1 % (ref 43.0–77.0)
Platelets: 223 K/uL (ref 150.0–400.0)
RBC: 4.02 Mil/uL (ref 3.87–5.11)
RDW: 13.5 % (ref 11.5–15.5)
WBC: 8.3 K/uL (ref 4.0–10.5)

## 2023-11-26 LAB — HEMOGLOBIN A1C: Hgb A1c MFr Bld: 6.3 % (ref 4.6–6.5)

## 2023-11-26 LAB — LIPID PANEL
Cholesterol: 102 mg/dL (ref 0–200)
HDL: 46.9 mg/dL (ref >39.00)
LDL Cholesterol: 30 mg/dL (ref 0–99)
NonHDL: 55.36
Total CHOL/HDL Ratio: 2
Triglycerides: 126 mg/dL (ref 0.0–149.0)
VLDL: 25.2 mg/dL (ref 0.0–40.0)

## 2023-11-26 LAB — VITAMIN D 25 HYDROXY (VIT D DEFICIENCY, FRACTURES): VITD: 45.56 ng/mL (ref 30.00–100.00)

## 2023-11-26 LAB — TSH: TSH: 4.24 u[IU]/mL (ref 0.35–5.50)

## 2023-11-26 LAB — MAGNESIUM: Magnesium: 1.8 mg/dL (ref 1.5–2.5)

## 2023-11-26 NOTE — Progress Notes (Signed)
 Subjective:    Patient ID: Erin Steele, female    DOB: 1927/06/05, 89 y.o.   MRN: [id number]  Chief Complaint  Patient presents with   Follow-up    HPI Discussed the use of AI scribe software for clinical note transcription with the patient, who gave verbal consent to proceed.  History of Present Illness Erin Steele is a 89 year old female who presents with leg and hip pain. She is accompanied by her daughter.  She experiences significant pain in her legs and hips, described as a burning sensation and cramping. The pain intensifies with activity, particularly after prolonged standing or walking, and is more severe at night.  She maintains hydration by drinking a lot of water and does not consume tea or other beverages. Her supplement regimen includes regular vitamin D  intake, and she uses magnesium and vitamin C seasonally. She takes cholestyramine  powder twice daily with [place] yogurt to aid in swallowing. Her medication history includes levothyroxine , currently at a dose of 50 mcg, which was increased from 25 mcg in May. She has received the flu and pneumonia vaccines, with the latter administered at [facility].  She enjoys eating, particularly vegetables, although she prefers them cooked more than they are prepared at her current residence. No issues with eating or bowel movements are reported, and she has not lost her appetite. No complaints of CP/palp/SOB/HA/congestion/fevers/GI or GU c/o. Taking meds as prescribed     Past Medical History:  Diagnosis Date   A-fib [facility])    Atrial fibrillation (HCC)    Bilateral rotator cuff dysfunction    CAD (coronary artery disease)    CHF (congestive heart failure) (HCC)    Diabetes mellitus with diabetic nephropathy (HCC)    Diabetes mellitus without complication (HCC)    High cholesterol    HIV infection (HCC)    Hypercalcemia 03/25/2018   Hyperlipemia    Hypertension    Hypokalemia    Osteoarthritis    Vitamin D   deficiency     Past Surgical History:  Procedure Laterality Date   ABDOMINAL HYSTERECTOMY     ANKLE SURGERY     and arms   APPENDECTOMY     chicken pox     intestinal surgery      for cancer   KIDNEY STONE SURGERY     measles      Family History  Problem Relation Age of Onset   COPD Mother        emphysema, nonsmoker   Heart disease Sister    Cancer Sister        lung CA in smoker   Heart disease Daughter    Heart disease Maternal Grandmother    Heart disease Maternal Grandfather    Heart disease Daughter     Social History   Socioeconomic History   Marital status: Married    Spouse name: Not on file   Number of children: Not on file   Years of education: Not on file   Highest education level: Not on file  Occupational History   Occupation: retired  Tobacco Use   Smoking status: Never   Smokeless tobacco: Never  Substance and Sexual Activity   Alcohol use: Yes    Comment: occasionally, mexican milkshake at Christmas   Drug use: No   Sexual activity: Not on file  Other Topics Concern   Not on file  Social History Narrative       was a homemaker with many side jobs   Raised 4 children  has relocated here from [facility] to be near family   Lives a [place] with husband   No dietary restrictions   Social Drivers of Corporate investment banker Strain: Not on file  Food Insecurity: Not on file  Transportation Needs: Not on file  Physical Activity: Not on file  Stress: Not on file  Social Connections: Unknown (06/05/2022)   Received from [facility]   Social Network    Social Network: Not on file  Intimate Partner Violence: Unknown (06/05/2022)   Received from [facility]   HITS    Physically Hurt: Not on file    Insult or Talk Down To: Not on file    Threaten Physical Harm: Not on file    Scream or Curse: Not on file    Outpatient Medications Prior to Visit  Medication Sig Dispense Refill   azelastine  (ASTELIN ) 0.1 % nasal spray Place 2 sprays  into both nostrils 2 (two) times daily. Use in each nostril as directed 30 mL 12   ELIQUIS  5 MG TABS tablet TAKE 1 TABLET BY MOUTH TWICE A DAY 180 tablet 3   furosemide  (LASIX ) 20 MG tablet TAKE 1 TABLET BY MOUTH EVERY DAY 90 tablet 2   levothyroxine  (SYNTHROID ) 50 MCG tablet TAKE 1 TABLET BY MOUTH EVERY DAY 90 tablet 1   loperamide (IMODIUM) 2 MG capsule Take 2 mg by mouth as needed for diarrhea or loose stools.     losartan  (COZAAR ) 25 MG tablet TAKE 1 TABLET (25 MG TOTAL) BY MOUTH DAILY. 90 tablet 2   metoprolol  succinate (TOPROL -XL) 25 MG 24 hr tablet TAKE 1 TABLET (25 MG TOTAL) BY MOUTH DAILY. 90 tablet 2   potassium chloride  SA (KLOR-CON  M) 20 MEQ tablet TAKE 1 TABLET BY MOUTH EVERY DAY 90 tablet 2   pravastatin  (PRAVACHOL ) 20 MG tablet TAKE 1 TABLET BY MOUTH EVERY DAY 90 tablet 2   sertraline  (ZOLOFT ) 25 MG tablet TAKE 1 TABLET (25 MG TOTAL) BY MOUTH DAILY. 90 tablet 2   cholestyramine  (QUESTRAN ) 4 GM/DOSE powder USE 1/2 SCOOP 3 TIMES A DAY WITH MEALS 378 g 1   levothyroxine  (SYNTHROID ) 25 MCG tablet TAKE 1 TABLET BY MOUTH DAILY BEFORE BREAKFAST. 90 tablet 2   No facility-administered medications prior to visit.    Allergies  Allergen Reactions   Morphine And Codeine Anxiety and Other (See Comments)    Pt is unsure of allergy-02/17/18 Pt is unsure of allergy-02/17/18 Pt is unsure of allergy-02/17/18    Penicillins Other (See Comments)    Doesn't remember reaction Doesn't remember reaction    Covid-19 (Mrna) Vaccine Other (See Comments)    myalgia    Review of Systems  Constitutional:  Positive for malaise/fatigue. Negative for fever.  HENT:  Negative for congestion.   Eyes:  Negative for blurred vision.  Respiratory:  Negative for shortness of breath.   Cardiovascular:  Negative for chest pain, palpitations and leg swelling.  Gastrointestinal:  Negative for abdominal pain, blood in stool and nausea.  Genitourinary:  Negative for dysuria and frequency.  Musculoskeletal:   Positive for joint pain and myalgias. Negative for falls.  Skin:  Negative for rash.  Neurological:  Positive for sensory change. Negative for dizziness, loss of consciousness and headaches.  Endo/Heme/Allergies:  Negative for environmental allergies.  Psychiatric/Behavioral:  Negative for depression. The patient is not nervous/anxious.        Objective:    Physical Exam Constitutional:      General: She is not in acute  distress.    Appearance: Normal appearance. She is well-developed. She is not toxic-appearing.  HENT:     Head: Normocephalic and atraumatic.     Right Ear: External ear normal.     Left Ear: External ear normal.     Nose: Nose normal.  Eyes:     General:        Right eye: No discharge.        Left eye: No discharge.     Conjunctiva/sclera: Conjunctivae normal.  Neck:     Thyroid : No thyromegaly.  Cardiovascular:     Rate and Rhythm: Normal rate and regular rhythm.     Heart sounds: Normal heart sounds. No murmur heard. Pulmonary:     Effort: Pulmonary effort is normal. No respiratory distress.     Breath sounds: Normal breath sounds.  Abdominal:     General: Bowel sounds are normal.     Palpations: Abdomen is soft.     Tenderness: There is no abdominal tenderness. There is no guarding.  Musculoskeletal:        General: Normal range of motion.     Cervical back: Neck supple.  Lymphadenopathy:     Cervical: No cervical adenopathy.  Skin:    General: Skin is warm and dry.  Neurological:     Mental Status: She is alert and oriented to person, place, and time.  Psychiatric:        Mood and Affect: Mood normal.        Behavior: Behavior normal.        Thought Content: Thought content normal.        Judgment: Judgment normal.     BP 122/78 (BP Location: Left Arm, Patient Position: Sitting, Cuff Size: Normal)   Pulse 74   Temp 97.9 F (36.6 C) (Oral)   Resp 14   Ht 5' 4 (1.626 m)   Wt 169 lb (76.7 kg)   SpO2 96%   BMI 29.01 kg/m  Wt Readings  from Last 3 Encounters:  11/25/23 169 lb (76.7 kg)  08/08/23 168 lb (76.2 kg)  05/23/23 166 lb (75.3 kg)    Diabetic Foot Exam - Simple   No data filed    Lab Results  Component Value Date   WBC 8.3 11/25/2023   HGB 13.0 11/25/2023   HCT 39.4 11/25/2023   PLT 223.0 11/25/2023   GLUCOSE 95 11/25/2023   CHOL 102 11/25/2023   TRIG 126.0 11/25/2023   HDL 46.90 11/25/2023   LDLDIRECT 28.0 01/19/2021   LDLCALC 30 11/25/2023   ALT 13 11/25/2023   AST 18 11/25/2023   NA 142 11/25/2023   K 4.1 11/25/2023   CL 108 11/25/2023   CREATININE 1.24 (H) 11/25/2023   BUN 26 (H) 11/25/2023   CO2 24 11/25/2023   TSH 4.24 11/25/2023   HGBA1C 6.3 11/25/2023    Lab Results  Component Value Date   TSH 4.24 11/25/2023   Lab Results  Component Value Date   WBC 8.3 11/25/2023   HGB 13.0 11/25/2023   HCT 39.4 11/25/2023   MCV 97.9 11/25/2023   PLT 223.0 11/25/2023   Lab Results  Component Value Date   NA 142 11/25/2023   K 4.1 11/25/2023   CO2 24 11/25/2023   GLUCOSE 95 11/25/2023   BUN 26 (H) 11/25/2023   CREATININE 1.24 (H) 11/25/2023   BILITOT 0.7 11/25/2023   ALKPHOS 70 11/25/2023   AST 18 11/25/2023   ALT 13 11/25/2023   PROT 6.8 11/25/2023  ALBUMIN 4.7 11/25/2023   CALCIUM 9.7 11/25/2023   GFR 37.34 (L) 11/25/2023   Lab Results  Component Value Date   CHOL 102 11/25/2023   Lab Results  Component Value Date   HDL 46.90 11/25/2023   Lab Results  Component Value Date   LDLCALC 30 11/25/2023   Lab Results  Component Value Date   TRIG 126.0 11/25/2023   Lab Results  Component Value Date   CHOLHDL 2 11/25/2023   Lab Results  Component Value Date   HGBA1C 6.3 11/25/2023       Assessment & Plan:  High cholesterol Assessment & Plan: Encourage heart healthy diet such as MIND or DASH diet, increase exercise, avoid trans fats, simple carbohydrates and processed foods, consider a krill or fish or flaxseed oil cap daily.   Orders: -     Lipid panel -      TSH  Hyperglycemia -     Comprehensive metabolic panel with GFR -     Hemoglobin A1c -     TSH  Hypertension, unspecified type Assessment & Plan: Well controlled, no changes to meds. Encouraged heart healthy diet such as the DASH diet and exercise as tolerated.   Orders: -     CBC with Differential/Platelet -     TSH  Hypothyroidism, unspecified type Assessment & Plan: On Levothyroxine , continue to monitor   Muscle cramps Assessment & Plan: Hydrate and monitor  Orders: -     Magnesium  Vitamin D  deficiency Assessment & Plan: Supplement and monitor  Orders: -     VITAMIN D  25 Hydroxy (Vit-D Deficiency, Fractures)  Chronic systolic heart failure (HCC) Assessment & Plan: No recent exacerbation  Orders: -     CBC with Differential/Platelet  Permanent atrial fibrillation (HCC) Assessment & Plan: Rate controlled    Other orders -     Cholestyramine ; USE 1/2 SCOOP TWICE A DAY WITH MEALS  Dispense: 378 g; Refill: 1    Assessment and Plan Assessment & Plan Muscle cramps and leg pain with peripheral neuropathy Chronic leg pain and muscle cramps, particularly affecting the feet and legs up to the knees. Symptoms worsen with activity and at night. Possible contributing factors include dehydration and poor circulation. - Recommend wearing compression socks on days with increased activity. - Encourage continued hydration to prevent dehydration-related cramps. - Suggest using Biofreeze or CBD cream on legs at bedtime to improve sleep quality.  Hypothyroidism Currently on levothyroxine  50 mcg daily. Recent TSH levels indicated undertreatment, leading to an increase from 25 mcg to 50 mcg. Monitoring of TSH levels is ongoing to ensure appropriate dosing. - Order TSH test to monitor thyroid  function. - Adjust levothyroxine  dosage based on TSH results.  Recording duration: 23 minutes     Harlene Horton, MD

## 2023-11-28 ENCOUNTER — Telehealth: Payer: Self-pay | Admitting: *Deleted

## 2023-11-28 NOTE — Telephone Encounter (Signed)
 Left message at [facility] [place] to see if they can send us  a copy of pt immunization records.

## 2023-12-02 ENCOUNTER — Ambulatory Visit: Admitting: Family Medicine

## 2023-12-11 ENCOUNTER — Ambulatory Visit: Admitting: Student

## 2023-12-17 ENCOUNTER — Other Ambulatory Visit: Payer: Self-pay | Admitting: Family Medicine

## 2023-12-30 ENCOUNTER — Encounter (HOSPITAL_COMMUNITY): Payer: Self-pay | Admitting: Radiology

## 2023-12-30 ENCOUNTER — Other Ambulatory Visit: Payer: Self-pay

## 2023-12-30 ENCOUNTER — Emergency Department (HOSPITAL_COMMUNITY)

## 2023-12-30 ENCOUNTER — Emergency Department (HOSPITAL_COMMUNITY): Admission: EM | Admit: 2023-12-30 | Discharge: 2023-12-30 | Disposition: A | Discharge status: Home / Self Care

## 2023-12-30 DIAGNOSIS — I4891 Unspecified atrial fibrillation: Secondary | ICD-10-CM | POA: Diagnosis not present

## 2023-12-30 DIAGNOSIS — N281 Cyst of kidney, acquired: Secondary | ICD-10-CM | POA: Diagnosis not present

## 2023-12-30 DIAGNOSIS — E039 Hypothyroidism, unspecified: Secondary | ICD-10-CM | POA: Insufficient documentation

## 2023-12-30 DIAGNOSIS — R109 Unspecified abdominal pain: Secondary | ICD-10-CM | POA: Diagnosis not present

## 2023-12-30 DIAGNOSIS — K5730 Diverticulosis of large intestine without perforation or abscess without bleeding: Secondary | ICD-10-CM | POA: Diagnosis not present

## 2023-12-30 DIAGNOSIS — I10 Essential (primary) hypertension: Secondary | ICD-10-CM | POA: Insufficient documentation

## 2023-12-30 DIAGNOSIS — Z7901 Long term (current) use of anticoagulants: Secondary | ICD-10-CM | POA: Diagnosis not present

## 2023-12-30 DIAGNOSIS — Z79899 Other long term (current) drug therapy: Secondary | ICD-10-CM | POA: Insufficient documentation

## 2023-12-30 DIAGNOSIS — R1030 Lower abdominal pain, unspecified: Secondary | ICD-10-CM | POA: Diagnosis not present

## 2023-12-30 DIAGNOSIS — N200 Calculus of kidney: Secondary | ICD-10-CM | POA: Diagnosis not present

## 2023-12-30 DIAGNOSIS — N12 Tubulo-interstitial nephritis, not specified as acute or chronic: Secondary | ICD-10-CM | POA: Diagnosis not present

## 2023-12-30 LAB — COMPREHENSIVE METABOLIC PANEL WITH GFR
ALT: 16 U/L (ref 0–44)
AST: 29 U/L (ref 15–41)
Albumin: 4.3 g/dL (ref 3.5–5.0)
Alkaline Phosphatase: 80 U/L (ref 38–126)
Anion gap: 13 (ref 5–15)
BUN: 26 mg/dL — ABNORMAL HIGH (ref 8–23)
CO2: 18 mmol/L — ABNORMAL LOW (ref 22–32)
Calcium: 9.4 mg/dL (ref 8.9–10.3)
Chloride: 110 mmol/L (ref 98–111)
Creatinine, Ser: 1.17 mg/dL — ABNORMAL HIGH (ref 0.44–1.00)
GFR, Estimated: 43 mL/min — ABNORMAL LOW (ref >60)
Glucose, Bld: 113 mg/dL — ABNORMAL HIGH (ref 70–99)
Potassium: 3.9 mmol/L (ref 3.5–5.1)
Sodium: 141 mmol/L (ref 135–145)
Total Bilirubin: 0.6 mg/dL (ref 0.0–1.2)
Total Protein: 6.5 g/dL (ref 6.5–8.1)

## 2023-12-30 LAB — URINALYSIS, ROUTINE W REFLEX MICROSCOPIC
Bilirubin Urine: NEGATIVE
Glucose, UA: NEGATIVE mg/dL
Ketones, ur: NEGATIVE mg/dL
Nitrite: NEGATIVE
Specific Gravity, Urine: 1.025 (ref 1.005–1.030)
pH: 6 (ref 5.0–8.0)

## 2023-12-30 LAB — CBC WITH DIFFERENTIAL/PLATELET
Abs Immature Granulocytes: 0.03 K/uL (ref 0.00–0.07)
Basophils Absolute: 0 K/uL (ref 0.0–0.1)
Basophils Relative: 0 %
Eosinophils Absolute: 0.1 K/uL (ref 0.0–0.5)
Eosinophils Relative: 2 %
HCT: 39.3 % (ref 36.0–46.0)
Hemoglobin: 12.5 g/dL (ref 12.0–15.0)
Immature Granulocytes: 0 %
Lymphocytes Relative: 35 %
Lymphs Abs: 2.4 K/uL (ref 0.7–4.0)
MCH: 32.4 pg (ref 26.0–34.0)
MCHC: 31.8 g/dL (ref 30.0–36.0)
MCV: 101.8 fL — ABNORMAL HIGH (ref 80.0–100.0)
Monocytes Absolute: 0.7 K/uL (ref 0.1–1.0)
Monocytes Relative: 10 %
Neutro Abs: 3.7 K/uL (ref 1.7–7.7)
Neutrophils Relative %: 53 %
Platelets: 206 K/uL (ref 150–400)
RBC: 3.86 MIL/uL — ABNORMAL LOW (ref 3.87–5.11)
RDW: 13.5 % (ref 11.5–15.5)
WBC: 7 K/uL (ref 4.0–10.5)
nRBC: 0 % (ref 0.0–0.2)

## 2023-12-30 LAB — LIPASE, BLOOD: Lipase: 52 U/L — ABNORMAL HIGH (ref 11–51)

## 2023-12-30 MED ORDER — FENTANYL CITRATE PF 50 MCG/ML IJ SOSY
12.5000 ug | PREFILLED_SYRINGE | Freq: Once | INTRAMUSCULAR | Status: AC
  Administered 2023-12-30: 12.5 ug via INTRAVENOUS
  Filled 2023-12-30: qty 1

## 2023-12-30 MED ORDER — CEPHALEXIN 500 MG PO CAPS: 500.0000 mg | ORAL_CAPSULE | Freq: Four times a day (QID) | ORAL | 0 refills | Status: DC

## 2023-12-30 MED ORDER — CEPHALEXIN 250 MG PO CAPS: 250.0000 mg | ORAL_CAPSULE | Freq: Once | ORAL | Status: DC

## 2023-12-30 MED ORDER — IOHEXOL 300 MG/ML  SOLN
75.0000 mL | Freq: Once | INTRAMUSCULAR | Status: AC | PRN
  Administered 2023-12-30: 75 mL via INTRAVENOUS

## 2023-12-30 MED ORDER — CEPHALEXIN 500 MG PO CAPS
500.0000 mg | ORAL_CAPSULE | Freq: Once | ORAL | Status: AC
  Administered 2023-12-30: 500 mg via ORAL
  Filled 2023-12-30: qty 1

## 2023-12-30 NOTE — ED Provider Notes (Signed)
 [facility] EMERGENCY DEPARTMENT AT [facility] Provider Note   CSN: [id number] Arrival date & time: 12/30/23  9348     Patient presents with: Flank Pain   Erin Steele is a 89 y.o. female.   89 year old female with past medical history of atrial fibrillation on Eliquis  as well as hypothyroidism and hypertension presenting to the emergency department today with flank pain.  The patient states she is having right sided flank pain has been going on now for the past week.  Has been intermittent.  She reports the pain was worse last night so she came to the ER for further evaluation.  The patient reports normal bowel movements.  Denies any blood in her urine.  She denies any fevers.  States that this feels similar to when she has had kidney stones in the past.  She has had some nausea and decreased appetite but denies any vomiting.   Flank Pain       Prior to Admission medications   Medication Sig Start Date End Date Taking? Authorizing Provider  cephALEXin  (KEFLEX ) 500 MG capsule Take 1 capsule (500 mg total) by mouth 4 (four) times daily. 12/30/23  Yes Ula Prentice SAUNDERS, MD  azelastine  (ASTELIN ) 0.1 % nasal spray Place 2 sprays into both nostrils 2 (two) times daily. Use in each nostril as directed 05/31/22   Domenica Harlene LABOR, MD  cholestyramine  (QUESTRAN ) 4 GM/DOSE powder USE 1/2 SCOOP TWICE A DAY WITH MEALS 11/25/23   Domenica Harlene LABOR, MD  ELIQUIS  5 MG TABS tablet TAKE 1 TABLET BY MOUTH TWICE A DAY 10/09/23   Webb, Padonda B, FNP  furosemide  (LASIX ) 20 MG tablet TAKE 1 TABLET BY MOUTH EVERY DAY 11/20/23   Domenica Harlene LABOR, MD  levothyroxine  (SYNTHROID ) 50 MCG tablet TAKE 1 TABLET BY MOUTH EVERY DAY 11/13/23   Domenica Harlene LABOR, MD  loperamide (IMODIUM) 2 MG capsule Take 2 mg by mouth as needed for diarrhea or loose stools.    [provider]  losartan  (COZAAR ) 25 MG tablet TAKE 1 TABLET (25 MG TOTAL) BY MOUTH DAILY. 11/20/23   Domenica Harlene LABOR, MD  metoprolol  succinate  (TOPROL -XL) 25 MG 24 hr tablet TAKE 1 TABLET (25 MG TOTAL) BY MOUTH DAILY. 11/20/23   Domenica Harlene LABOR, MD  potassium chloride  SA (KLOR-CON  M) 20 MEQ tablet TAKE 1 TABLET BY MOUTH EVERY DAY 11/20/23   Domenica Harlene LABOR, MD  pravastatin  (PRAVACHOL ) 20 MG tablet TAKE 1 TABLET BY MOUTH EVERY DAY 11/20/23   Domenica Harlene LABOR, MD  sertraline  (ZOLOFT ) 25 MG tablet TAKE 1 TABLET (25 MG TOTAL) BY MOUTH DAILY. 09/03/23   Domenica Harlene LABOR, MD    Allergies: Morphine and codeine, Penicillins, and Covid-19 (mrna) vaccine    Review of Systems  Genitourinary:  Positive for flank pain.  All other systems reviewed and are negative.   Updated Vital Signs BP (!) 108/56   Pulse 75   Temp 98.7 F (37.1 C)   Resp 13   Ht 5' 4 (1.626 m)   Wt 76.7 kg   SpO2 97%   BMI 29.02 kg/m   Physical Exam Vitals and nursing note reviewed.   Gen: NAD Eyes: PERRL, EOMI HEENT: no oropharyngeal swelling Neck: trachea midline Resp: clear to auscultation bilaterally Card: RRR, no murmurs, rubs, or gallops Abd: Tender over the right periumbilical region without guarding or rebound Extremities: no calf tenderness, no edema Vascular: 2+ radial pulses bilaterally, 2+ DP pulses bilaterally Skin: no rashes Psyc: acting appropriately   (  all labs ordered are listed, but only abnormal results are displayed) Labs Reviewed  CBC WITH DIFFERENTIAL/PLATELET - Abnormal; Notable for the following components:      Result Value   RBC 3.86 (*)    MCV 101.8 (*)    All other components within normal limits  COMPREHENSIVE METABOLIC PANEL WITH GFR - Abnormal; Notable for the following components:   CO2 18 (*)    Glucose, Bld 113 (*)    BUN 26 (*)    Creatinine, Ser 1.17 (*)    GFR, Estimated 43 (*)    All other components within normal limits  LIPASE, BLOOD - Abnormal; Notable for the following components:   Lipase 52 (*)    All other components within normal limits  URINALYSIS, ROUTINE W REFLEX MICROSCOPIC - Abnormal; Notable for  the following components:   APPearance CLEAR (*)    Hgb urine dipstick TRACE (*)    Protein, ur TRACE (*)    Leukocytes,Ua SMALL (*)    Bacteria, UA RARE (*)    All other components within normal limits    EKG: None  Radiology: CT ABDOMEN PELVIS W CONTRAST Result Date: 12/30/2023 CLINICAL DATA:  Right-sided abdominal/flank pain beginning yesterday. Possible renal stone. EXAM: CT ABDOMEN AND PELVIS WITH CONTRAST TECHNIQUE: Multidetector CT imaging of the abdomen and pelvis was performed using the standard protocol following bolus administration of intravenous contrast. RADIATION DOSE REDUCTION: This exam was performed according to the departmental dose-optimization program which includes automated exposure control, adjustment of the mA and/or kV according to patient size and/or use of iterative reconstruction technique. CONTRAST:  75mL OMNIPAQUE  IOHEXOL  300 MG/ML  SOLN COMPARISON:  None. FINDINGS: Lower chest: Mild cardiomegaly. Calcified plaque over the descending thoracic aorta. Minimal bibasilar scarring/atelectasis. Hepatobiliary: Mild diffuse low-attenuation of the liver without focal mass. Subtle nodularity to the contour of the anterior left lobe. Previous cholecystectomy. Biliary tree is unremarkable. Pancreas: Normal. Spleen: Normal. Adrenals/Urinary Tract: Adrenal glands are normal. Kidneys are normal size. 2.7 cm right renal cyst. No hydronephrosis or nephrolithiasis. Ureters and bladder are normal. Stomach/Bowel: Stomach and small bowel are normal. Prior appendectomy. Mild diverticulosis of the colon without active inflammation. Suture line over the right colon near the cecum possibly due to previous partial colectomy. Minimal redundancy of the sigmoid colon. Vascular/Lymphatic: Moderate calcified plaque over the abdominal aorta which is normal in caliber. Remaining vascular structures are unremarkable. No adenopathy. Reproductive: Uterus and bilateral adnexa are unremarkable. Other: No free  fluid or focal inflammatory change. Musculoskeletal: Moderate degenerative change of the spine with grade 1 anterolisthesis of L4 on L5. Mild osteoarthritic changes of the hips. IMPRESSION: 1. No acute findings in the abdomen/pelvis. 2. Mild colonic diverticulosis without active inflammation. Postsurgical change likely due to previous partial right colectomy. 3. 2.7 cm right renal cyst. No further imaging follow-up recommended. 4. Morphologic changes within the liver which could be seen with cirrhosis. 5. Aortic atherosclerosis. Aortic Atherosclerosis (ICD10-I70.0). Electronically Signed   By: Toribio Agreste M.D.   On: 12/30/2023 09:16     Procedures   Medications Ordered in the ED  cephALEXin  (KEFLEX ) capsule 500 mg (has no administration in time range)  fentaNYL  (SUBLIMAZE ) injection 12.5 mcg (12.5 mcg Intravenous Given 12/30/23 0745)  iohexol  (OMNIPAQUE ) 300 MG/ML solution 75 mL (75 mLs Intravenous Contrast Given 12/30/23 0846)  Medical Decision Making 89 year old female with past medical history of atrial fibrillation on Eliquis  as well as hypothyroidism and kidney stones in the past presents emergency department today flank pain.  I will give the patient low-dose fentanyl  here for pain.  Will obtain basic labs including LFTs and lipase and urinalysis to evaluate for hepatobiliary pathology, pancreatitis, pyelonephritis or electrolyte abnormalities.  Will also obtain a CT scan to evaluate for ureterolithiasis, diverticulitis, colitis, AAA, or other intra-abdominal pathology.  I will reevaluate for ultimate disposition.  The patient CT scan does not show any acute pathology.  Urinalysis is positive for leukocytes with many bacteria and some WBCs.  There are some epithelial cells but given the patient's symptoms we will go ahead and treat with Keflex  for pyelonephritis.  The patient remains hemodynamically stable here.  The remainder of her labs are reassuring.  She  will be discharged with return precautions.  Amount and/or Complexity of Data Reviewed Labs: ordered. Radiology: ordered.  Risk Prescription drug management.        Final diagnoses:  Pyelonephritis    ED Discharge Orders          Ordered    cephALEXin  (KEFLEX ) 500 MG capsule  4 times daily        12/30/23 1110               Ula Prentice SAUNDERS, MD 12/30/23 [phone]

## 2023-12-30 NOTE — ED Notes (Signed)
 Pt transported to and from CT by CT tech

## 2023-12-30 NOTE — ED Notes (Signed)
 Pt verbalizes understanding of pain medication risks and benefits. Pt agrees with fall precuations

## 2023-12-30 NOTE — ED Triage Notes (Addendum)
 Patient arrived with complaints of right sided flank pain that started yesterday. Hx of kidney stones, reports it feels the same.

## 2023-12-30 NOTE — ED Notes (Signed)
 Pt verbalizes understanding of DC instructions. Pt belongings returned and is assisted in [facility] and family out of ED.

## 2023-12-30 NOTE — Discharge Instructions (Addendum)
 Please take the Keflex  as prescribed.  Take Tylenol as needed for pain.  Please be sure to drink plenty of fluids and follow-up with your doctor.  Return to the ER for worsening symptoms.

## 2023-12-30 NOTE — ED Notes (Signed)
 Hourly rounding complete. Pt alert or resting, no distress noted, offered toileting and diet as appropriate. Side rails up, call light within reach. Pt denies pain or further needs at this time.

## 2024-01-01 ENCOUNTER — Encounter: Payer: Self-pay | Admitting: Family Medicine

## 2024-01-01 ENCOUNTER — Ambulatory Visit: Payer: Self-pay

## 2024-01-01 NOTE — Telephone Encounter (Signed)
 Can someone sched the pt so she can be evaluated?

## 2024-01-01 NOTE — Telephone Encounter (Signed)
 Pt needs new medication , had reaction to keflex  which was prescribed by ED on 12/30/2023

## 2024-01-01 NOTE — Telephone Encounter (Signed)
 Ok to sched. Thx.

## 2024-01-01 NOTE — Telephone Encounter (Deleted)
g

## 2024-01-01 NOTE — Telephone Encounter (Signed)
 See nurse triage note.

## 2024-01-01 NOTE — Telephone Encounter (Signed)
 FYI Only or Action Required?: Action required by provider: Medication change d/t allergic reaction.  Patient was last seen in primary care on 11/25/2023 by Domenica Harlene LABOR, MD.  Called Nurse Triage reporting Medication Reaction.  Symptoms began yesterday.  Interventions attempted: OTC medications: Benadryl cream, stopped antibiotic.  Symptoms are: stable.  Triage Disposition: See Physician Within 24 Hours  Patient/caregiver understands and will follow disposition?: No, refuses disposition  Reason for Disposition  Taking new prescription antibiotic  (Exception: Finished taking new prescription antibiotic.)  Answer Assessment - Initial Assessment Questions *Medication Change Requested*  Spoke with pts daughter Arna (on [place]) for onset of rash/welts on lower left side of body. Pt discharged 9/22 Monday afternoon from hospital for pyelonephritis and prescribed new med Keflex  at discharge, onset of rash 24 hours after starting med. No face tongue or lip swelling. Advised to see HCP in 24 hrs, daughter declines scheduling in person appt d/t pts mobility limitations. Asking to have provider change med or for virtual visit instead. Message sent to provider office with pts daughters request.   1. APPEARANCE of RASH: What does the rash look like? (e.g., spots, blisters, raised areas, skin peeling, scaly)     Red welts 2. SIZE: How big are the spots? (e.g., tip of pen, eraser, coin; inches, centimeters)     Very large, on left side 3. LOCATION: Where is the rash located?     Left hip, leg and stomach 4. COLOR: What color is the rash? (Note: It is difficult to assess rash color in people with darker-colored skin. When this situation occurs, simply ask the caller to describe what they see.)     Welts  5. ONSET: When did the rash begin?     Last night 6. FEVER: Do you have a fever? If Yes, ask: What is your temperature, how was it measured, and when did it start?     No 7.  ITCHING: Does the rash itch? If Yes, ask: How bad is the itch? (Scale 1-10; or mild, moderate, severe)     Itching, benadryl cream applied.  8. CAUSE: What do you think is causing the rash?     New medication Keflex  9. NEW MEDICINES: What new medicines are you taking? (e.g., name of antibiotic) When did you start taking this medication?.     Keflex  10. OTHER SYMPTOMS: Do you have any other symptoms? (e.g., sore throat, fever, joint pain)       No  Protocols used: Rash - Widespread On Drugs-A-AH

## 2024-01-02 ENCOUNTER — Ambulatory Visit: Payer: Self-pay

## 2024-01-02 ENCOUNTER — Telehealth: Payer: Self-pay | Admitting: Family Medicine

## 2024-01-02 ENCOUNTER — Other Ambulatory Visit: Payer: Self-pay | Admitting: Family Medicine

## 2024-01-02 DIAGNOSIS — I10 Essential (primary) hypertension: Secondary | ICD-10-CM

## 2024-01-02 DIAGNOSIS — N390 Urinary tract infection, site not specified: Secondary | ICD-10-CM

## 2024-01-02 MED ORDER — SULFAMETHOXAZOLE-TRIMETHOPRIM 800-160 MG PO TABS: 1.0000 | ORAL_TABLET | Freq: Two times a day (BID) | ORAL | 0 refills | Status: AC

## 2024-01-02 NOTE — Telephone Encounter (Signed)
 Pt lives in assisted living, pt was dx with kidney infection on Monday. Pt started with rash yesterday. Pt was advised by her POA not to take the abx anymore. Pt POA requesting different antibiotic. POA states that she is treating the s/s of the allergic rxn. POA denies any s/s aside from rash. Denies SOB/difficulty breathing. POA would like the new medication today, states that she is leaving town and the pt self administers, requesting the new medication today ASAP.   Copied from CRM #[id number]. Topic: Clinical - Red Word Triage >> Jan 02, 2024 11:24 AM Erin Steele wrote: Red Word that prompted transfer to Nurse Triage: Allergic reaction Still has hives/rash .Erin Steele Still needs another antibiotic Reason for Disposition . [1] Caller has URGENT medicine question about med that primary care doctor (or NP/PA) or specialist prescribed AND [2] triager unable to answer question  Protocols used: Medication Question Call-A-AH

## 2024-01-02 NOTE — Telephone Encounter (Signed)
 Looks like multiple duplicate messages are happening. Pt's daughter has sent several mychart messages, this message was forwarded to Dr. Domenica earlier today. Triage note yesterday was sent to DOD (Dr. Frann) who recommended Pt has an in person visit here (Pt is 89 y/o and currently in assisting living facility). I'm getting ready to leave for the day. Sending to Luke armin Ping to make sure message is answered today before leaving.

## 2024-01-02 NOTE — Telephone Encounter (Signed)
 Patient's daughter was advised and verbalized understanding. She schedule patient a lab appt on 01/06/24 @ 9:30 PM.

## 2024-01-02 NOTE — Telephone Encounter (Signed)
 Copied from CRM [id number]. Topic: General - Call Back - No Documentation >> Jan 02, 2024  1:01 PM Leah C wrote: Reason for CRM: Patient's daughter called in to follow up on whether Dr. Domenica can call in an antibiotic for her mom. Patient was red word yesterday and today. Daughter is not able to bring mom in for an appointment due to her leaving for out of town tonight and wants to know if a prescription can be called in for pick up later today. Patient's daughter would appreciate a follow up call.   [phone] (M)

## 2024-01-02 NOTE — Telephone Encounter (Signed)
 Looks like multiple duplicate messages are happening. Pt's daughter has sent several mychart messages, this message was forwarded to Dr. Domenica earlier today. Triage note yesterday was sent to DOD (Dr. Frann) who recommended Pt has an in person visit here (Pt is 89 y/o and currently in assisting living facility). I'm getting ready to leave for the day. Sending to Luke armin Ping to make sure message is answered today before leaving. I am closing this note so we can document off of the other triage note from today.

## 2024-01-02 NOTE — Telephone Encounter (Unsigned)
 Copied from CRM [id number]. Topic: General - Call Back - No Documentation >> Jan 02, 2024  1:01 PM Leah C wrote: Reason for CRM: Patient's daughter called in to follow up on whether Dr. Domenica can call in an antibiotic for her mom. Patient was red word yesterday and today. Daughter is not able to bring mom in for an appointment due to her leaving for out of town tonight and wants to know if a prescription can be called in for pick up later today. Patient's daughter would appreciate a follow up call.   [phone] (M)

## 2024-01-03 NOTE — Telephone Encounter (Signed)
 Patients daughter was advised already.

## 2024-01-06 ENCOUNTER — Ambulatory Visit: Payer: Self-pay | Admitting: Family Medicine

## 2024-01-06 ENCOUNTER — Other Ambulatory Visit (INDEPENDENT_AMBULATORY_CARE_PROVIDER_SITE_OTHER)

## 2024-01-06 DIAGNOSIS — N390 Urinary tract infection, site not specified: Secondary | ICD-10-CM

## 2024-01-06 DIAGNOSIS — I10 Essential (primary) hypertension: Secondary | ICD-10-CM

## 2024-01-06 LAB — CBC WITH DIFFERENTIAL/PLATELET
Basophils Absolute: 0 K/uL (ref 0.0–0.1)
Basophils Relative: 0.3 % (ref 0.0–3.0)
Eosinophils Absolute: 0.1 K/uL (ref 0.0–0.7)
Eosinophils Relative: 1.5 % (ref 0.0–5.0)
HCT: 36.7 % (ref 36.0–46.0)
Hemoglobin: 12.2 g/dL (ref 12.0–15.0)
Lymphocytes Relative: 27.4 % (ref 12.0–46.0)
Lymphs Abs: 1.9 K/uL (ref 0.7–4.0)
MCHC: 33.2 g/dL (ref 30.0–36.0)
MCV: 98.1 fl (ref 78.0–100.0)
Monocytes Absolute: 0.7 K/uL (ref 0.1–1.0)
Monocytes Relative: 10.1 % (ref 3.0–12.0)
Neutro Abs: 4.1 K/uL (ref 1.4–7.7)
Neutrophils Relative %: 60.7 % (ref 43.0–77.0)
Platelets: 210 K/uL (ref 150.0–400.0)
RBC: 3.74 Mil/uL — ABNORMAL LOW (ref 3.87–5.11)
RDW: 14.1 % (ref 11.5–15.5)
WBC: 6.8 K/uL (ref 4.0–10.5)

## 2024-01-06 LAB — URINALYSIS
Bilirubin Urine: NEGATIVE
Hgb urine dipstick: NEGATIVE
Ketones, ur: NEGATIVE
Leukocytes,Ua: NEGATIVE
Nitrite: NEGATIVE
Specific Gravity, Urine: 1.025 (ref 1.000–1.030)
Total Protein, Urine: NEGATIVE
Urine Glucose: NEGATIVE
Urobilinogen, UA: 0.2 (ref 0.0–1.0)
pH: 6 (ref 5.0–8.0)

## 2024-01-06 LAB — COMPREHENSIVE METABOLIC PANEL WITH GFR
ALT: 13 U/L (ref 0–35)
AST: 20 U/L (ref 0–37)
Albumin: 4.5 g/dL (ref 3.5–5.2)
Alkaline Phosphatase: 64 U/L (ref 39–117)
BUN: 23 mg/dL (ref 6–23)
CO2: 23 meq/L (ref 19–32)
Calcium: 9.7 mg/dL (ref 8.4–10.5)
Chloride: 111 meq/L (ref 96–112)
Creatinine, Ser: 1.51 mg/dL — ABNORMAL HIGH (ref 0.40–1.20)
GFR: 29.46 mL/min — ABNORMAL LOW (ref >60.00)
Glucose, Bld: 105 mg/dL — ABNORMAL HIGH (ref 70–99)
Potassium: 4.5 meq/L (ref 3.5–5.1)
Sodium: 140 meq/L (ref 135–145)
Total Bilirubin: 0.9 mg/dL (ref 0.2–1.2)
Total Protein: 6.6 g/dL (ref 6.0–8.3)

## 2024-01-06 NOTE — Progress Notes (Signed)
 Patient reviewed via MyChart and called to schedule lab appointment for pt, no answer.

## 2024-01-07 LAB — URINE CULTURE
MICRO NUMBER:: 17029361
Result:: NO GROWTH
SPECIMEN QUALITY:: ADEQUATE

## 2024-01-09 NOTE — Progress Notes (Signed)
 Patient's daughter was made aware to reach to the office for sooner appointment on yesterday,01/08/24.

## 2024-01-25 ENCOUNTER — Other Ambulatory Visit: Payer: Self-pay | Admitting: Family Medicine

## 2024-01-27 ENCOUNTER — Ambulatory Visit: Admitting: Family Medicine

## 2024-04-07 ENCOUNTER — Encounter: Payer: Self-pay | Admitting: Family Medicine

## 2024-04-07 MED ORDER — SERTRALINE HCL 25 MG PO TABS: 25.0000 mg | ORAL_TABLET | Freq: Every day | ORAL | 2 refills | Status: AC

## 2024-04-16 ENCOUNTER — Ambulatory Visit: Admitting: Family Medicine

## 2024-04-25 ENCOUNTER — Other Ambulatory Visit: Payer: Self-pay | Admitting: Family Medicine

## 2024-05-21 ENCOUNTER — Ambulatory Visit: Admitting: Family Medicine

## 2024-08-11 ENCOUNTER — Encounter: Admitting: Family Medicine

## 2024-11-26 ENCOUNTER — Ambulatory Visit: Admitting: Family Medicine
